# Patient Record
Sex: Male | Born: 1970
Health system: Southern US, Community
[De-identification: ages and names within clinical notes are randomized; demographics above are authoritative.]

## PROBLEM LIST (undated history)

## (undated) DIAGNOSIS — I10 Essential (primary) hypertension: Secondary | ICD-10-CM

## (undated) DIAGNOSIS — Z8619 Personal history of other infectious and parasitic diseases: Secondary | ICD-10-CM

## (undated) DIAGNOSIS — L237 Allergic contact dermatitis due to plants, except food: Secondary | ICD-10-CM

## (undated) DIAGNOSIS — B029 Zoster without complications: Secondary | ICD-10-CM

## (undated) DIAGNOSIS — E781 Pure hyperglyceridemia: Secondary | ICD-10-CM

## (undated) DIAGNOSIS — J329 Chronic sinusitis, unspecified: Secondary | ICD-10-CM

## (undated) DIAGNOSIS — K760 Fatty (change of) liver, not elsewhere classified: Secondary | ICD-10-CM

## (undated) DIAGNOSIS — K219 Gastro-esophageal reflux disease without esophagitis: Secondary | ICD-10-CM

## (undated) HISTORY — DX: Essential (primary) hypertension: I10

## (undated) HISTORY — DX: Allergic contact dermatitis due to plants, except food: L23.7

## (undated) HISTORY — DX: Chronic sinusitis, unspecified: J32.9

## (undated) HISTORY — PX: TOOTH EXTRACTION: SUR596

## (undated) HISTORY — DX: Personal history of other infectious and parasitic diseases: Z86.19

## (undated) HISTORY — DX: Gastro-esophageal reflux disease without esophagitis: K21.9

## (undated) HISTORY — DX: Pure hyperglyceridemia: E78.1

## (undated) HISTORY — DX: Fatty (change of) liver, not elsewhere classified: K76.0

## (undated) HISTORY — PX: WISDOM TOOTH EXTRACTION: SHX21

## (undated) HISTORY — DX: Zoster without complications: B02.9

---

## 2002-06-01 ENCOUNTER — Emergency Department (HOSPITAL_COMMUNITY): Admission: EM | Admit: 2002-06-01 | Discharge: 2002-06-02 | Payer: Self-pay | Admitting: *Deleted

## 2002-06-02 ENCOUNTER — Encounter: Payer: Self-pay | Admitting: *Deleted

## 2013-02-06 ENCOUNTER — Ambulatory Visit (INDEPENDENT_AMBULATORY_CARE_PROVIDER_SITE_OTHER): Payer: 59 | Admitting: Internal Medicine

## 2013-02-06 VITALS — BP 133/89 | HR 78 | Temp 98.3°F | Resp 16 | Ht 76.0 in | Wt 208.0 lb

## 2013-02-06 DIAGNOSIS — R509 Fever, unspecified: Secondary | ICD-10-CM

## 2013-02-06 DIAGNOSIS — T148 Other injury of unspecified body region: Secondary | ICD-10-CM

## 2013-02-06 DIAGNOSIS — W57XXXA Bitten or stung by nonvenomous insect and other nonvenomous arthropods, initial encounter: Secondary | ICD-10-CM

## 2013-02-06 LAB — POCT CBC
Granulocyte percent: 47.1 %G (ref 37–80)
HCT, POC: 42.3 % — AB (ref 43.5–53.7)
Hemoglobin: 13.5 g/dL — AB (ref 14.1–18.1)
Lymph, poc: 1.5 (ref 0.6–3.4)
MCH, POC: 30.9 pg (ref 27–31.2)
MCHC: 31.9 g/dL (ref 31.8–35.4)
MCV: 96.7 fL (ref 80–97)
MID (cbc): 0.3 (ref 0–0.9)
MPV: 9.4 fL (ref 0–99.8)
POC Granulocyte: 1.6 — AB (ref 2–6.9)
POC LYMPH PERCENT: 44.9 %L (ref 10–50)
POC MID %: 8 %M (ref 0–12)
Platelet Count, POC: 204 10*3/uL (ref 142–424)
RBC: 4.37 M/uL — AB (ref 4.69–6.13)
RDW, POC: 12.9 %
WBC: 3.4 10*3/uL — AB (ref 4.6–10.2)

## 2013-02-06 LAB — COMPREHENSIVE METABOLIC PANEL
ALT: 40 U/L (ref 0–53)
AST: 27 U/L (ref 0–37)
Albumin: 4.5 g/dL (ref 3.5–5.2)
Alkaline Phosphatase: 76 U/L (ref 39–117)
BUN: 16 mg/dL (ref 6–23)
CO2: 29 mEq/L (ref 19–32)
Calcium: 9.8 mg/dL (ref 8.4–10.5)
Chloride: 104 mEq/L (ref 96–112)
Creat: 1.18 mg/dL (ref 0.50–1.35)
Glucose, Bld: 104 mg/dL — ABNORMAL HIGH (ref 70–99)
Potassium: 4.4 mEq/L (ref 3.5–5.3)
Sodium: 140 mEq/L (ref 135–145)
Total Bilirubin: 0.5 mg/dL (ref 0.3–1.2)
Total Protein: 7.4 g/dL (ref 6.0–8.3)

## 2013-02-06 MED ORDER — DOXYCYCLINE HYCLATE 100 MG PO TABS: 100.0000 mg | ORAL_TABLET | Freq: Two times a day (BID) | ORAL | Status: DC

## 2013-02-06 NOTE — Progress Notes (Signed)
  Subjective:    Patient ID: Kenneth Austin, male    DOB: 09-27-1970, 42 y.o.   MRN: [id number]  HPI 2 weeks ago he pulled a tick off his right ankle that was engorged and embedded This skin lesion resolved and became red again 2 days ago Yesterday he developed fever and chills with nausea and increased gas//he had a mild headache and felt too dizzy to drive//his face was flushed//he lost his appetite    Review of Systems Denies neck pain, cough, chest pain, nasal congestion other than recent allergies, sore throat, lymph node swelling, rash, abdominal pain, dysuria or frequency, palpitations, edema, joint swelling and tenderness    Objective:   Physical Exam BP 133/89  Pulse 78  Temp(Src) 98.3 F (36.8 C) (Oral)  Resp 16  Ht 6\' 4"  (1.93 m)  Wt 208 lb (94.348 kg)  BMI 25.33 kg/m2  SpO2 100% Pupils equal round reactive to light and accommodation/EOMs conjugate/conjunctiva not injected Nares clear/oropharynx clear/no nodes or thyromegaly Chest clear to auscultation Heart regular without murmur Abdomen benign Neck has full range of motion without tenderness Skin is clear except for tick bite granuloma in the left ankle and 2 small insect bites on the left leg Joints are clear Face is flushed There is no confusion/neurological intact  Results for orders placed in visit on 02/06/13  POCT CBC      Result Value Range   WBC 3.4 (*) 4.6 - 10.2 K/uL   Lymph, poc 1.5  0.6 - 3.4   POC LYMPH PERCENT 44.9  10 - 50 %L   MID (cbc) 0.3  0 - 0.9   POC MID % 8.0  0 - 12 %M   POC Granulocyte 1.6 (*) 2 - 6.9   Granulocyte percent 47.1  37 - 80 %G   RBC 4.37 (*) 4.69 - 6.13 M/uL   Hemoglobin 13.5 (*) 14.1 - 18.1 g/dL   HCT, POC 08.6 (*) 57.8 - 53.7 %   MCV 96.7  80 - 97 fL   MCH, POC 30.9  27 - 31.2 pg   MCHC 31.9  31.8 - 35.4 g/dL   RDW, POC 46.9     Platelet Count, POC 204  142 - 424 K/uL   MPV 9.4  0 - 99.8 fL          Assessment & Plan:  Tick bite with granuloma Fever,  unspecified   Viral illness versus ehrlichiosis versus RMSF Doxy 100bid zofran if needed Close f/u if worse HO given

## 2013-02-07 LAB — ROCKY MTN SPOTTED FVR AB, IGM-BLOOD: ROCKY MTN SPOTTED FEVER, IGM: 0.79 IV

## 2014-01-25 ENCOUNTER — Telehealth: Payer: Self-pay

## 2014-01-25 ENCOUNTER — Ambulatory Visit (INDEPENDENT_AMBULATORY_CARE_PROVIDER_SITE_OTHER): Payer: 59 | Admitting: Family Medicine

## 2014-01-25 ENCOUNTER — Encounter: Payer: Self-pay | Admitting: Family Medicine

## 2014-01-25 VITALS — BP 126/80 | HR 79 | Temp 98.0°F | Resp 16 | Ht 76.0 in | Wt 207.5 lb

## 2014-01-25 DIAGNOSIS — Z0001 Encounter for general adult medical examination with abnormal findings: Secondary | ICD-10-CM | POA: Insufficient documentation

## 2014-01-25 DIAGNOSIS — Z0000 Encounter for general adult medical examination without abnormal findings: Secondary | ICD-10-CM

## 2014-01-25 DIAGNOSIS — L255 Unspecified contact dermatitis due to plants, except food: Secondary | ICD-10-CM

## 2014-01-25 DIAGNOSIS — L237 Allergic contact dermatitis due to plants, except food: Secondary | ICD-10-CM | POA: Insufficient documentation

## 2014-01-25 MED ORDER — TRIAMCINOLONE ACETONIDE 0.1 % EX OINT: 1.0000 "application " | TOPICAL_OINTMENT | Freq: Two times a day (BID) | CUTANEOUS | Status: DC

## 2014-01-25 MED ORDER — PREDNISONE 10 MG PO TABS: ORAL_TABLET | ORAL | Status: DC

## 2014-01-25 NOTE — Progress Notes (Signed)
   Subjective:    Patient ID: Kenneth Austin, male    DOB: 11-14-1970, 43 y.o.   MRN: [id number]  HPI New to establish.  Previous MD- none recently  CPE- no concerns.   Review of Systems Patient reports no vision/hearing changes, anorexia, fever ,adenopathy, persistant/recurrent hoarseness, swallowing issues, chest pain, palpitations, edema, persistant/recurrent cough, hemoptysis, dyspnea (rest,exertional, paroxysmal nocturnal), gastrointestinal  bleeding (melena, rectal bleeding), abdominal pain, excessive heart burn, GU symptoms (dysuria, hematuria, voiding/incontinence issues) syncope, focal weakness, memory loss, numbness & tingling, skin/hair/nail changes, depression, anxiety, abnormal bruising/bleeding, musculoskeletal symptoms/signs.     Objective:   Physical Exam General Appearance:    Alert, cooperative, no distress, appears stated age  Head:    Normocephalic, without obvious abnormality, atraumatic  Eyes:    PERRL, conjunctiva/corneas clear, EOM's intact, fundi    benign, both eyes       Ears:    Normal TM's and external ear canals, both ears  Nose:   Nares normal, septum midline, mucosa normal, no drainage   or sinus tenderness  Throat:   Lips, mucosa, and tongue normal; teeth and gums normal  Neck:   Supple, symmetrical, trachea midline, no adenopathy;       thyroid:  No enlargement/tenderness/nodules  Back:     Symmetric, no curvature, ROM normal, no CVA tenderness  Lungs:     Clear to auscultation bilaterally, respirations unlabored  Chest wall:    No tenderness or deformity  Heart:    Regular rate and rhythm, S1 and S2 normal, no murmur, rub   or gallop  Abdomen:     Soft, non-tender, bowel sounds active all four quadrants,    no masses, no organomegaly  Genitalia:    Normal male without lesion, masses,discharge or tenderness  Rectal:    Deferred due to young age  Extremities:   Extremities normal, atraumatic, no cyanosis or edema  Pulses:   2+ and symmetric all  extremities  Skin:   Skin color, texture, turgor normal, poison ivy on forearms bilaterally and on R flank  Lymph nodes:   Cervical, supraclavicular, and axillary nodes normal  Neurologic:   CNII-XII intact. Normal strength, sensation and reflexes      throughout          Assessment & Plan:

## 2014-01-25 NOTE — Telephone Encounter (Signed)
Medication List and allergies:  Reviewed and updated  90 day supply/mail order: n/a Local prescriptions:  [id number] - [place], [place] - [address] AT [place]  Immunization due:  UTD  A/P: Personal, family and PSH: Reviewed and updated Tdap- received in 2012 per patient   To discuss with provider: Pt would like a physical with labs and shingles vaccine.

## 2014-01-25 NOTE — Progress Notes (Signed)
Pre visit review using our clinic review tool, if applicable. No additional management support is needed unless otherwise documented below in the visit note. 

## 2014-01-25 NOTE — Assessment & Plan Note (Signed)
Pt's PE WNL w/ exception of poison ivy.  Check labs.  Anticipatory guidance provided.

## 2014-01-25 NOTE — Assessment & Plan Note (Signed)
New.  Start pred taper and topical triamcinolone.  Reviewed supportive care and red flags that should prompt return.  Pt expressed understanding and is in agreement w/ plan.

## 2014-01-25 NOTE — Patient Instructions (Signed)
Follow up in 1 year or as needed Start the Prednisone as directed- take w/ food We'll notify you of your lab results and make any changes if needed Call with any questions or concerns Welcome!  We're glad to have you! Keep up the good work!

## 2014-01-26 ENCOUNTER — Encounter: Payer: Self-pay | Admitting: General Practice

## 2014-01-26 LAB — CBC WITH DIFFERENTIAL/PLATELET
Basophils Absolute: 0 10*3/uL (ref 0.0–0.1)
Basophils Relative: 0.5 % (ref 0.0–3.0)
Eosinophils Absolute: 0.1 10*3/uL (ref 0.0–0.7)
Eosinophils Relative: 1.4 % (ref 0.0–5.0)
HCT: 44.3 % (ref 39.0–52.0)
Hemoglobin: 14.7 g/dL (ref 13.0–17.0)
Lymphocytes Relative: 40.6 % (ref 12.0–46.0)
Lymphs Abs: 2 10*3/uL (ref 0.7–4.0)
MCHC: 33.1 g/dL (ref 30.0–36.0)
MCV: 92.9 fl (ref 78.0–100.0)
Monocytes Absolute: 0.4 10*3/uL (ref 0.1–1.0)
Monocytes Relative: 7.4 % (ref 3.0–12.0)
Neutro Abs: 2.5 10*3/uL (ref 1.4–7.7)
Neutrophils Relative %: 50.1 % (ref 43.0–77.0)
Platelets: 248 10*3/uL (ref 150.0–400.0)
RBC: 4.77 Mil/uL (ref 4.22–5.81)
RDW: 12.3 % (ref 11.5–15.5)
WBC: 5 10*3/uL (ref 4.0–10.5)

## 2014-01-26 LAB — HEPATIC FUNCTION PANEL
ALT: 35 U/L (ref 0–53)
AST: 31 U/L (ref 0–37)
Albumin: 4.6 g/dL (ref 3.5–5.2)
Alkaline Phosphatase: 77 U/L (ref 39–117)
Bilirubin, Direct: 0 mg/dL (ref 0.0–0.3)
Total Bilirubin: 0.2 mg/dL (ref 0.2–1.2)
Total Protein: 7.8 g/dL (ref 6.0–8.3)

## 2014-01-26 LAB — LIPID PANEL
Cholesterol: 221 mg/dL — ABNORMAL HIGH (ref 0–200)
HDL: 40.6 mg/dL (ref >39.00)
LDL Cholesterol: 110 mg/dL — ABNORMAL HIGH (ref 0–99)
NonHDL: 180.4
Total CHOL/HDL Ratio: 5
Triglycerides: 350 mg/dL — ABNORMAL HIGH (ref 0.0–149.0)
VLDL: 70 mg/dL — ABNORMAL HIGH (ref 0.0–40.0)

## 2014-01-26 LAB — BASIC METABOLIC PANEL
BUN: 14 mg/dL (ref 6–23)
CO2: 28 mEq/L (ref 19–32)
Calcium: 9.7 mg/dL (ref 8.4–10.5)
Chloride: 101 mEq/L (ref 96–112)
Creatinine, Ser: 1 mg/dL (ref 0.4–1.5)
GFR: 84.64 mL/min (ref >60.00)
Glucose, Bld: 74 mg/dL (ref 70–99)
Potassium: 3.9 mEq/L (ref 3.5–5.1)
Sodium: 138 mEq/L (ref 135–145)

## 2014-01-26 LAB — TSH: TSH: 1.12 u[IU]/mL (ref 0.35–4.50)

## 2015-01-12 ENCOUNTER — Encounter: Payer: Self-pay | Admitting: Family Medicine

## 2015-01-12 ENCOUNTER — Ambulatory Visit (INDEPENDENT_AMBULATORY_CARE_PROVIDER_SITE_OTHER): Payer: 59 | Admitting: Family Medicine

## 2015-01-12 VITALS — BP 122/80 | HR 84 | Temp 98.5°F | Resp 16 | Wt 215.2 lb

## 2015-01-12 DIAGNOSIS — H5789 Other specified disorders of eye and adnexa: Secondary | ICD-10-CM | POA: Insufficient documentation

## 2015-01-12 DIAGNOSIS — H578 Other specified disorders of eye and adnexa: Secondary | ICD-10-CM

## 2015-01-12 DIAGNOSIS — L03211 Cellulitis of face: Secondary | ICD-10-CM | POA: Diagnosis not present

## 2015-01-12 MED ORDER — AMOXICILLIN-POT CLAVULANATE 875-125 MG PO TABS: 1.0000 | ORAL_TABLET | Freq: Two times a day (BID) | ORAL | Status: DC

## 2015-01-12 MED ORDER — POLYMYXIN B-TRIMETHOPRIM 10000-0.1 UNIT/ML-% OP SOLN: OPHTHALMIC | Status: DC

## 2015-01-12 NOTE — Patient Instructions (Signed)
Follow up as needed Start the Augmentin twice daily- take w/ food Hot compresses to improve pain/swelling Start daily Claritin/Zyrtec for allergic inflammation Use the eye drops only if your eye becomes red or starts draining Call with any questions or concerns Hang in there! Have a great weekend!

## 2015-01-12 NOTE — Progress Notes (Signed)
   Subjective:    Patient ID: Kenneth Austin, male    DOB: [birth year], 44 y.o.   MRN: [id number]  HPI R eye swelling- woke up yesterday w/ R under eye swollen and tender.  This AM woke up w/ entire R eye crusted shut.  No redness of conjunctiva.  Pt is outside regularly.  No recent sinus pressure/congestion.  Hx of recurrent 'pink eye'.  No one at home w/ similar sxs.   Review of Systems For ROS see HPI     Objective:   Physical Exam  Constitutional: He is oriented to person, place, and time. He appears well-developed and well-nourished. No distress.  HENT:  Head: Normocephalic and atraumatic.  Right Ear: External ear normal.  Left Ear: External ear normal.  Mouth/Throat: Oropharynx is clear and moist. No oropharyngeal exudate.  Eyes: Conjunctivae and EOM are normal. Pupils are equal, round, and reactive to light. Right eye exhibits no discharge. Left eye exhibits no discharge.  Erythema and swelling of R lower lid extending inferiorly to cheek bone.  No TTP  Lymphadenopathy:    He has no cervical adenopathy.  Neurological: He is alert and oriented to person, place, and time.  Skin: Skin is warm and dry. There is erythema.  Psychiatric: He has a normal mood and affect. His behavior is normal. Thought content normal.  Vitals reviewed.         Assessment & Plan:

## 2015-01-12 NOTE — Progress Notes (Signed)
Pre visit review using our clinic review tool, if applicable. No additional management support is needed unless otherwise documented below in the visit note. 

## 2015-01-15 ENCOUNTER — Encounter: Payer: Self-pay | Admitting: Family Medicine

## 2015-01-15 NOTE — Assessment & Plan Note (Signed)
New.  Area below R eye is more concerning for facial cellulitis than pink eye.  Start abx.  Start antihistamine to decrease eye drainage and inflammatory response.  Reviewed supportive care and red flags that should prompt return.  Pt expressed understanding and is in agreement w/ plan.

## 2015-01-15 NOTE — Assessment & Plan Note (Signed)
New.  No evidence of pink eye or stye at time of OV.  Pt has hx of recurrent pink eye and is concerned w/ all the rubbing he has been doing, that he will develop this over the holiday weekend.  Explained that the oral abx should adequately tx any conjunctivitis.  Pt was given eye drops to use only as needed in case of conjunctivitis.  Pt expressed understanding and is in agreement w/ plan.

## 2015-01-16 ENCOUNTER — Telehealth: Payer: Self-pay | Admitting: General Practice

## 2015-01-16 ENCOUNTER — Other Ambulatory Visit: Payer: Self-pay | Admitting: Physician Assistant

## 2015-01-16 MED ORDER — DOXYCYCLINE HYCLATE 100 MG PO CAPS: 100.0000 mg | ORAL_CAPSULE | Freq: Two times a day (BID) | ORAL | Status: DC

## 2015-01-16 NOTE — Telephone Encounter (Signed)
Will stop Augmentin -- loose stools and abdominal cramping likely side effect.  If no loose stools remain or BRBPR there is little concern for c. Diff. Will switch to Doxycycline for facial cellutlitis.  Patient to follow-up via phone tomorrow regarding how he is doing.

## 2015-01-16 NOTE — Telephone Encounter (Signed)
Per Mychart message:   Please call patient to triage -- if loose stools and abdominal pain still present, needs appointment. If resolved then we will triage based on any other symptoms remaining.         ----- Message -----     From: Kris Hartmann, CMA     Sent: 01/16/2015  8:06 AM      To: Brunetta Jeans, PA-C    Subject: FW: Visit Follow-Up Question                         ----- Message -----     From: Roxine Caddy     Sent: 01/15/2015  1:54 PM      To: [facility]    Subject: Visit Follow-Up Question                   The Augmentin that I have been taking since Friday for the cellulitis around my eye has caused stomach pains and loose stool. I have been taking it with food. Today I have a lot of bright red blood in my stool. My eye is getting better. Should I switch to another antibiotic?    Thanks,    Kenneth Austin    [phone]      Spoke with patient. He advised that he stopped the augmentin yesterday (only took 1 of 2 doses). Pt states that he is not having abdominal pain today and no loose stools or bleeding. Please advise.

## 2015-03-02 ENCOUNTER — Ambulatory Visit (INDEPENDENT_AMBULATORY_CARE_PROVIDER_SITE_OTHER): Payer: 59 | Admitting: Family Medicine

## 2015-03-02 ENCOUNTER — Encounter: Payer: Self-pay | Admitting: Family Medicine

## 2015-03-02 VITALS — BP 120/80 | HR 100 | Temp 99.1°F | Resp 16 | Wt 210.4 lb

## 2015-03-02 DIAGNOSIS — L237 Allergic contact dermatitis due to plants, except food: Secondary | ICD-10-CM | POA: Diagnosis not present

## 2015-03-02 MED ORDER — TRIAMCINOLONE ACETONIDE 0.1 % EX OINT: 1.0000 "application " | TOPICAL_OINTMENT | Freq: Two times a day (BID) | CUTANEOUS | Status: DC

## 2015-03-02 NOTE — Progress Notes (Signed)
Pre visit review using our clinic review tool, if applicable. No additional management support is needed unless otherwise documented below in the visit note. 

## 2015-03-02 NOTE — Patient Instructions (Signed)
This appears to be plant related contact dermatitis Apply the Triamcinolone twice daily Benadryl as needed at night for itching If no improvement or worsening- please let me know! If there is a tiny little piece of thorn in there, it will come out Call with any questions or concerns Have a great weekend!

## 2015-03-02 NOTE — Progress Notes (Signed)
   Subjective:    Patient ID: Kenneth Austin, male    DOB: 1971/02/12, 44 y.o.   MRN: [id number]  HPI Skin infxn- pt was doing yard work on Sunday and had '2 black spots' on L lower leg.  Scratches present from thorns. Area developed intense redness and spreading satellite lesions.  No drainage, no pain.  + itching.  No pus, no fluctuance.  Denies fevers, sweats, chills.   Review of Systems For ROS see HPI     Objective:   Physical Exam  Constitutional: He appears well-developed and well-nourished. No distress.  Cardiovascular: Intact distal pulses.   Skin: Skin is warm and dry. Rash (vesicular rash w/ satellite lesions (consisten w/ plant dermatitis) surround 2 smaller, dark scabbed areas) noted. There is erythema (deeply red, almost purple areas on L anterior shin).  Vitals reviewed.         Assessment & Plan:

## 2015-03-03 NOTE — Assessment & Plan Note (Signed)
Pt w/ intense reaction to plant dermatitis.  No evidence of sporotrichicosis.  No evidence of infxn- no pus, no fluctuance, no TTP.  Start w/ topical steroid ointment as area is well confined to L lower leg.  Reviewed supportive care and red flags that should prompt return.  Pt expressed understanding and is in agreement w/ plan.

## 2015-10-24 ENCOUNTER — Ambulatory Visit (INDEPENDENT_AMBULATORY_CARE_PROVIDER_SITE_OTHER): Payer: 59 | Admitting: Family Medicine

## 2015-10-24 ENCOUNTER — Encounter: Payer: Self-pay | Admitting: Family Medicine

## 2015-10-24 VITALS — BP 153/88 | HR 101 | Temp 98.1°F | Resp 16 | Ht 76.0 in | Wt 217.5 lb

## 2015-10-24 DIAGNOSIS — IMO0001 Reserved for inherently not codable concepts without codable children: Secondary | ICD-10-CM | POA: Insufficient documentation

## 2015-10-24 DIAGNOSIS — R030 Elevated blood-pressure reading, without diagnosis of hypertension: Secondary | ICD-10-CM | POA: Diagnosis not present

## 2015-10-24 DIAGNOSIS — R7989 Other specified abnormal findings of blood chemistry: Secondary | ICD-10-CM

## 2015-10-24 LAB — LIPID PANEL
Cholesterol: 214 mg/dL — ABNORMAL HIGH (ref 0–200)
HDL: 46.3 mg/dL (ref >39.00)
NonHDL: 167.27
Total CHOL/HDL Ratio: 5
Triglycerides: 322 mg/dL — ABNORMAL HIGH (ref 0.0–149.0)
VLDL: 64.4 mg/dL — ABNORMAL HIGH (ref 0.0–40.0)

## 2015-10-24 LAB — HEPATIC FUNCTION PANEL
ALBUMIN: 4.7 g/dL (ref 3.5–5.2)
ALK PHOS: 79 U/L (ref 39–117)
ALT: 67 U/L — ABNORMAL HIGH (ref 0–53)
AST: 39 U/L — AB (ref 0–37)
Bilirubin, Direct: 0.1 mg/dL (ref 0.0–0.3)
TOTAL PROTEIN: 7.5 g/dL (ref 6.0–8.3)
Total Bilirubin: 0.6 mg/dL (ref 0.2–1.2)

## 2015-10-24 LAB — CBC WITH DIFFERENTIAL/PLATELET
BASOS ABS: 0 10*3/uL (ref 0.0–0.1)
Basophils Relative: 0.3 % (ref 0.0–3.0)
EOS PCT: 0.5 % (ref 0.0–5.0)
Eosinophils Absolute: 0 10*3/uL (ref 0.0–0.7)
HCT: 44.5 % (ref 39.0–52.0)
Hemoglobin: 15.2 g/dL (ref 13.0–17.0)
LYMPHS ABS: 1.9 10*3/uL (ref 0.7–4.0)
Lymphocytes Relative: 38.9 % (ref 12.0–46.0)
MCHC: 34.2 g/dL (ref 30.0–36.0)
MCV: 91.3 fl (ref 78.0–100.0)
MONO ABS: 0.5 10*3/uL (ref 0.1–1.0)
Monocytes Relative: 9.2 % (ref 3.0–12.0)
Neutro Abs: 2.5 10*3/uL (ref 1.4–7.7)
Neutrophils Relative %: 51.1 % (ref 43.0–77.0)
Platelets: 218 10*3/uL (ref 150.0–400.0)
RBC: 4.88 Mil/uL (ref 4.22–5.81)
RDW: 12.5 % (ref 11.5–15.5)
WBC: 5 10*3/uL (ref 4.0–10.5)

## 2015-10-24 LAB — LDL CHOLESTEROL, DIRECT: Direct LDL: 126 mg/dL

## 2015-10-24 LAB — BASIC METABOLIC PANEL
BUN: 16 mg/dL (ref 6–23)
CHLORIDE: 101 meq/L (ref 96–112)
CO2: 29 meq/L (ref 19–32)
Calcium: 9.7 mg/dL (ref 8.4–10.5)
Creatinine, Ser: 0.99 mg/dL (ref 0.40–1.50)
GFR: 87.85 mL/min (ref >60.00)
GLUCOSE: 91 mg/dL (ref 70–99)
Potassium: 3.9 mEq/L (ref 3.5–5.1)
Sodium: 139 mEq/L (ref 135–145)

## 2015-10-24 LAB — TSH: TSH: 1.7 u[IU]/mL (ref 0.35–4.50)

## 2015-10-24 NOTE — Patient Instructions (Signed)
Follow up in 2-3 weeks to recheck blood pressure We'll notify you of your lab results and make any changes if needed Continue to work on healthy diet (low salt!) and regular exercise Drink plenty of fluids Call with any questions or concerns Hang in there!  We'll figure this out!

## 2015-10-24 NOTE — Progress Notes (Signed)
   Subjective:    Patient ID: Kenneth Austin, male    DOB: 03/29/1971, 45 y.o.   MRN: [id number]  HPI Elevated BP- pt went to dentist this AM and BP reading w/ wrist cuff was 152/110 and 156/107.  Was then hooked to BP machine and readings were done q5 minutes w/ 162/112, 158/103, 166/101, 169/108.  Denies CP, SOB, HAs, visual changes, dizziness, edema.  + family hx of HTN- grandfather had CVA at 30.  Pt reports diet was 'atrocious'- but started improving diet as of Jan 21.  Not exercising.  Pt reports decreased Na intake.     Review of Systems For ROS see HPI     Objective:   Physical Exam  Constitutional: He is oriented to person, place, and time. He appears well-developed and well-nourished. No distress.  HENT:  Head: Normocephalic and atraumatic.  Eyes: Conjunctivae and EOM are normal. Pupils are equal, round, and reactive to light.  Neck: Normal range of motion. Neck supple. No thyromegaly present.  Cardiovascular: Normal rate, regular rhythm, normal heart sounds and intact distal pulses.   No murmur heard. Pulmonary/Chest: Effort normal and breath sounds normal. No respiratory distress.  Abdominal: Soft. Bowel sounds are normal. He exhibits no distension.  Musculoskeletal: He exhibits no edema.  Lymphadenopathy:    He has no cervical adenopathy.  Neurological: He is alert and oriented to person, place, and time. No cranial nerve deficit.  Skin: Skin is warm and dry.  Psychiatric: He has a normal mood and affect. His behavior is normal.  Vitals reviewed.         Assessment & Plan:

## 2015-10-24 NOTE — Assessment & Plan Note (Signed)
New.  Pt's BP was elevated today but this is first episode of elevated BP.  + family hx in grandparents.  Reviewed lifestyle and dietary modifications.  Will get labs.  Pt to return in 2-3 weeks to recheck BP and start meds prn.  Pt expressed understanding and is in agreement w/ plan.

## 2015-10-24 NOTE — Progress Notes (Signed)
Pre visit review using our clinic review tool, if applicable. No additional management support is needed unless otherwise documented below in the visit note. 

## 2015-10-25 ENCOUNTER — Other Ambulatory Visit: Payer: Self-pay | Admitting: General Practice

## 2015-10-25 MED ORDER — FENOFIBRATE 160 MG PO TABS: 160.0000 mg | ORAL_TABLET | Freq: Every day | ORAL | Status: DC

## 2015-11-07 ENCOUNTER — Ambulatory Visit: Payer: 59 | Admitting: Family Medicine

## 2015-12-07 ENCOUNTER — Ambulatory Visit (INDEPENDENT_AMBULATORY_CARE_PROVIDER_SITE_OTHER): Payer: 59 | Admitting: Physician Assistant

## 2015-12-07 ENCOUNTER — Encounter: Payer: Self-pay | Admitting: Physician Assistant

## 2015-12-07 VITALS — BP 138/98 | HR 99 | Temp 98.7°F | Ht 76.0 in | Wt 218.6 lb

## 2015-12-07 DIAGNOSIS — J020 Streptococcal pharyngitis: Secondary | ICD-10-CM | POA: Diagnosis not present

## 2015-12-07 MED ORDER — CEFDINIR 300 MG PO CAPS: 300.0000 mg | ORAL_CAPSULE | Freq: Two times a day (BID) | ORAL | Status: DC

## 2015-12-07 MED FILL — CEFDINIR 300 MG CAPSULE: 300 | 10 days supply | Qty: 20 | Fill #0

## 2015-12-07 NOTE — Patient Instructions (Signed)
Please increase fluids. Get plenty of rest. Tylenol or Ibuprofen for throat pain. Place a humidifier in the bedroom. Take the antibiotic as directed with food.  Follow-up if symptoms are not resolving.  Strep Throat Strep throat is a bacterial infection of the throat. Your health care provider may call the infection tonsillitis or pharyngitis, depending on whether there is swelling in the tonsils or at the back of the throat. Strep throat is most common during the cold months of the year in children who are 43-5 years of age, but it can happen during any season in people of any age. This infection is spread from person to person (contagious) through coughing, sneezing, or close contact. CAUSES Strep throat is caused by the bacteria called Streptococcus pyogenes. RISK FACTORS This condition is more likely to develop in:  People who spend time in crowded places where the infection can spread easily.  People who have close contact with someone who has strep throat. SYMPTOMS Symptoms of this condition include:  Fever or chills.   Redness, swelling, or pain in the tonsils or throat.  Pain or difficulty when swallowing.  White or yellow spots on the tonsils or throat.  Swollen, tender glands in the neck or under the jaw.  Red rash all over the body (rare). DIAGNOSIS This condition is diagnosed by performing a rapid strep test or by taking a swab of your throat (throat culture test). Results from a rapid strep test are usually ready in a few minutes, but throat culture test results are available after one or two days. TREATMENT This condition is treated with antibiotic medicine. HOME CARE INSTRUCTIONS Medicines  Take over-the-counter and prescription medicines only as told by your health care provider.  Take your antibiotic as told by your health care provider. Do not stop taking the antibiotic even if you start to feel better.  Have family members who also have a sore throat or  fever tested for strep throat. They may need antibiotics if they have the strep infection. Eating and Drinking  Do not share food, drinking cups, or personal items that could cause the infection to spread to other people.  If swallowing is difficult, try eating soft foods until your sore throat feels better.  Drink enough fluid to keep your urine clear or pale yellow. General Instructions  Gargle with a salt-water mixture 3-4 times per day or as needed. To make a salt-water mixture, completely dissolve -1 tsp of salt in 1 cup of warm water.  Make sure that all household members wash their hands well.  Get plenty of rest.  Stay home from school or work until you have been taking antibiotics for 24 hours.  Keep all follow-up visits as told by your health care provider. This is important. SEEK MEDICAL CARE IF:  The glands in your neck continue to get bigger.  You develop a rash, cough, or earache.  You cough up a thick liquid that is green, yellow-brown, or bloody.  You have pain or discomfort that does not get better with medicine.  Your problems seem to be getting worse rather than better.  You have a fever. SEEK IMMEDIATE MEDICAL CARE IF:  You have new symptoms, such as vomiting, severe headache, stiff or painful neck, chest pain, or shortness of breath.  You have severe throat pain, drooling, or changes in your voice.  You have swelling of the neck, or the skin on the neck becomes red and tender.  You have signs of dehydration, such  as fatigue, dry mouth, and decreased urination.  You become increasingly sleepy, or you cannot wake up completely.  Your joints become red or painful.   This information is not intended to replace advice given to you by your health care provider. Make sure you discuss any questions you have with your health care provider.   Document Released: 08/01/2000 Document Revised: 04/25/2015 Document Reviewed: 11/27/2014 Elsevier Interactive  Patient Education [employer].

## 2015-12-07 NOTE — Progress Notes (Signed)
Pre visit review using our clinic tool,if applicable. No additional management support is needed unless otherwise documented below in the visit note.  

## 2015-12-09 NOTE — Progress Notes (Signed)
Patient presents to clinic today c/o 2 days of sore throat and fatigue. Endorses some mild nasal congestion. Denies fever, chills, cough, chest congestion. Endorses daughter with similar symptoms. States she was diagnosed with strep throat yesterday.  Past Medical History  Diagnosis Date  . Poison ivy   . Shingles   . Sinus infection   . History of chicken pox   . GERD (gastroesophageal reflux disease)     Current Outpatient Prescriptions on File Prior to Visit  Medication Sig Dispense Refill  . fenofibrate 160 MG tablet Take 1 tablet (160 mg total) by mouth daily. (Patient not taking: Reported on 12/07/2015) 30 tablet 6   No current facility-administered medications on file prior to visit.    Allergies  Allergen Reactions  . Augmentin [Amoxicillin-Pot Clavulanate] Other (See Comments)    Abdominal pain and loose stools.     Family History  Problem Relation Age of Onset  . Cancer Mother     lyposarcoma  . Hypertension Paternal Grandfather   . Stroke Paternal Grandfather   . Heart attack Maternal Grandmother     Social History   Social History  . Marital Status: Married    Spouse Name: N/A  . Number of Children: N/A  . Years of Education: N/A   Social History Main Topics  . Smoking status: Never Smoker   . Smokeless tobacco: None  . Alcohol Use: Yes     Comment: socially  . Drug Use: No  . Sexual Activity: Yes   Other Topics Concern  . None   Social History Narrative    Review of Systems - See HPI.  All other ROS are negative.  BP 138/98 mmHg  Pulse 99  Temp(Src) 98.7 F (37.1 C) (Oral)  Ht 6\' 4"  (1.93 m)  Wt 218 lb 9.6 oz (99.156 kg)  BMI 26.62 kg/m2  SpO2 98%  Physical Exam  Constitutional: He is oriented to person, place, and time and well-developed, well-nourished, and in no distress.  HENT:  Head: Normocephalic and atraumatic.  Right Ear: Tympanic membrane normal.  Left Ear: Tympanic membrane normal.  Nose: Nose normal.  Mouth/Throat:  Uvula is midline and mucous membranes are normal. Posterior oropharyngeal erythema present. No oropharyngeal exudate.  Eyes: Conjunctivae are normal.  Neck: Neck supple.  Pulmonary/Chest: Effort normal and breath sounds normal. No respiratory distress. He has no wheezes. He has no rales. He exhibits no tenderness.  Lymphadenopathy:    He has no cervical adenopathy.  Neurological: He is alert and oriented to person, place, and time.  Skin: Skin is warm and dry. No pallor.  Vitals reviewed.   Recent Results (from the past 2160 hour(s))  Lipid panel     Status: Abnormal   Collection Time: 10/24/15 11:54 AM  Result Value Ref Range   Cholesterol 214 (H) 0 - 200 mg/dL    Comment: ATP III Classification       Desirable:  < 200 mg/dL               Borderline High:  200 - 239 mg/dL          High:  > = 240 mg/dL   Triglycerides 322.0 (H) 0.0 - 149.0 mg/dL    Comment: Normal:  <150 mg/dLBorderline High:  150 - 199 mg/dL   HDL 46.30 >39.00 mg/dL   VLDL 64.4 (H) 0.0 - 40.0 mg/dL   Total CHOL/HDL Ratio 5     Comment:  Men          Women1/2 Average Risk     3.4          3.3Average Risk          5.0          4.42X Average Risk          9.6          7.13X Average Risk          15.0          11.0                       NonHDL 167.27     Comment: NOTE:  Non-HDL goal should be 30 mg/dL higher than patient's LDL goal (i.e. LDL goal of < 70 mg/dL, would have non-HDL goal of < 100 mg/dL)  Basic metabolic panel     Status: None   Collection Time: 10/24/15 11:54 AM  Result Value Ref Range   Sodium 139 135 - 145 mEq/L   Potassium 3.9 3.5 - 5.1 mEq/L   Chloride 101 96 - 112 mEq/L   CO2 29 19 - 32 mEq/L   Glucose, Bld 91 70 - 99 mg/dL   BUN 16 6 - 23 mg/dL   Creatinine, Ser 0.99 0.40 - 1.50 mg/dL   Calcium 9.7 8.4 - 10.5 mg/dL   GFR 87.85 >60.00 mL/min  TSH     Status: None   Collection Time: 10/24/15 11:54 AM  Result Value Ref Range   TSH 1.70 0.35 - 4.50 uIU/mL  Hepatic function panel      Status: Abnormal   Collection Time: 10/24/15 11:54 AM  Result Value Ref Range   Total Bilirubin 0.6 0.2 - 1.2 mg/dL   Bilirubin, Direct 0.1 0.0 - 0.3 mg/dL   Alkaline Phosphatase 79 39 - 117 U/L   AST 39 (H) 0 - 37 U/L   ALT 67 (H) 0 - 53 U/L   Total Protein 7.5 6.0 - 8.3 g/dL   Albumin 4.7 3.5 - 5.2 g/dL  CBC with Differential/Platelet     Status: None   Collection Time: 10/24/15 11:54 AM  Result Value Ref Range   WBC 5.0 4.0 - 10.5 K/uL   RBC 4.88 4.22 - 5.81 Mil/uL   Hemoglobin 15.2 13.0 - 17.0 g/dL   HCT 44.5 39.0 - 52.0 %   MCV 91.3 78.0 - 100.0 fl   MCHC 34.2 30.0 - 36.0 g/dL   RDW 12.5 11.5 - 15.5 %   Platelets 218.0 150.0 - 400.0 K/uL   Neutrophils Relative % 51.1 43.0 - 77.0 %   Lymphocytes Relative 38.9 12.0 - 46.0 %   Monocytes Relative 9.2 3.0 - 12.0 %   Eosinophils Relative 0.5 0.0 - 5.0 %   Basophils Relative 0.3 0.0 - 3.0 %   Neutro Abs 2.5 1.4 - 7.7 K/uL   Lymphs Abs 1.9 0.7 - 4.0 K/uL   Monocytes Absolute 0.5 0.1 - 1.0 K/uL   Eosinophils Absolute 0.0 0.0 - 0.7 K/uL   Basophils Absolute 0.0 0.0 - 0.1 K/uL  LDL cholesterol, direct     Status: None   Collection Time: 10/24/15 11:54 AM  Result Value Ref Range   Direct LDL 126.0 mg/dL    Comment: Optimal:  <100 mg/dLNear or Above Optimal:  100-129 mg/dLBorderline High:  130-159 mg/dLHigh:  160-189 mg/dLVery High:  >190 mg/dL    Assessment/Plan: 1. Streptococcal sore throat Rapid strep +. Patient is  penicillin-allergic. Will begin Omnicef BID x 10 days. Supportive measures and OTC medications reviewed with patient.  - cefdinir (OMNICEF) 300 MG capsule; Take 1 capsule (300 mg total) by mouth 2 (two) times daily.  Dispense: 20 capsule; Refill: 0

## 2015-12-10 ENCOUNTER — Encounter: Payer: Self-pay | Admitting: Family Medicine

## 2015-12-10 ENCOUNTER — Ambulatory Visit (INDEPENDENT_AMBULATORY_CARE_PROVIDER_SITE_OTHER): Payer: 59 | Admitting: Family Medicine

## 2015-12-10 VITALS — BP 141/96 | HR 114 | Temp 99.2°F | Ht 76.0 in | Wt 215.6 lb

## 2015-12-10 DIAGNOSIS — B340 Adenovirus infection, unspecified: Secondary | ICD-10-CM

## 2015-12-10 DIAGNOSIS — R509 Fever, unspecified: Secondary | ICD-10-CM | POA: Diagnosis not present

## 2015-12-10 DIAGNOSIS — J029 Acute pharyngitis, unspecified: Secondary | ICD-10-CM | POA: Diagnosis not present

## 2015-12-10 LAB — CBC
HCT: 44.3 % (ref 39.0–52.0)
Hemoglobin: 15 g/dL (ref 13.0–17.0)
MCHC: 33.9 g/dL (ref 30.0–36.0)
MCV: 91.5 fl (ref 78.0–100.0)
PLATELETS: 241 10*3/uL (ref 150.0–400.0)
RBC: 4.84 Mil/uL (ref 4.22–5.81)
RDW: 12.3 % (ref 11.5–15.5)
WBC: 10 10*3/uL (ref 4.0–10.5)

## 2015-12-10 LAB — COMPREHENSIVE METABOLIC PANEL
ALT: 43 U/L (ref 0–53)
AST: 26 U/L (ref 0–37)
Albumin: 4.6 g/dL (ref 3.5–5.2)
Alkaline Phosphatase: 99 U/L (ref 39–117)
BILIRUBIN TOTAL: 0.6 mg/dL (ref 0.2–1.2)
BUN: 12 mg/dL (ref 6–23)
CALCIUM: 10 mg/dL (ref 8.4–10.5)
CO2: 30 meq/L (ref 19–32)
CREATININE: 1.05 mg/dL (ref 0.40–1.50)
Chloride: 102 mEq/L (ref 96–112)
GFR: 82.03 mL/min (ref >60.00)
Glucose, Bld: 98 mg/dL (ref 70–99)
Potassium: 3.9 mEq/L (ref 3.5–5.1)
Sodium: 141 mEq/L (ref 135–145)
Total Protein: 8.1 g/dL (ref 6.0–8.3)

## 2015-12-10 LAB — POCT INFLUENZA A/B
INFLUENZA A, POC: NEGATIVE
INFLUENZA B, POC: NEGATIVE

## 2015-12-10 MED ORDER — HYDROCODONE-HOMATROPINE 5-1.5 MG/5ML PO SYRP: 5.0000 mL | ORAL_SOLUTION | Freq: Four times a day (QID) | ORAL | Status: DC | PRN

## 2015-12-10 MED FILL — HYDROCODONE-HOMATROPINE SYR: 5-1.5 | 6 days supply | Qty: 120 | Fill #0

## 2015-12-10 NOTE — Patient Instructions (Addendum)
Please go to the lab for a blood draw.  I should have your results back later on today and will be in touch.  For the time being continue the omnicef and also ibupforen, tylenol, and other OTC medications as needed, as well as the cough syrup The syrup has hydrocodone which will help with pain- however it will also make you sleepy!  We will touch base with your labs, but please also let me know if you are not improving over the next couple of days

## 2015-12-10 NOTE — Progress Notes (Signed)
Pre visit review using our clinic review tool, if applicable. No additional management support is needed unless otherwise documented below in the visit note. 

## 2015-12-10 NOTE — Progress Notes (Signed)
Kenneth Austin at [facility] [address], [place], [place] [address] [phone]  Date:  12/10/2015   Name:  Kenneth Austin   DOB:  1970-11-20   MRN:  [id number]  PCP:  Annye Asa, MD    Chief Complaint: Sore Throat   History of Present Illness:  Kenneth Austin is a 45 y.o. very pleasant male patient who presents with the following:  Today is Monday. He was seen here on Friday and dx with strep  (this POC result was not entered in record but I confirmed with provider who saw him that he did indeed have a positive rapid test).  Started on omnicef. However he has not gotten better as expected- he may be a little worse.  He has noted a temp (subjective) at home. Still has ST He noted onset of pinkeye early yesterday- his eyes were matted shut. He started some polytrim abx eye drops that he had a home.   He has vomited the last 2 nights- he thinks from severe cough.  His chest muscles hurt from vomiting and cough.  No diarrhea His daughter had strep last week.  She is now recovered after abx.   He is generally in good health Admits he has not been eating or drinking much, partially due to severe ST He is using OTC medications as needed- took ibuprofen already today   Patient Active Problem List   Diagnosis Date Noted  . Elevated BP 10/24/2015  . Facial cellulitis 01/12/2015  . Irritation of right eye 01/12/2015  . Poison ivy 01/25/2014  . Routine general medical examination at a health care facility 01/25/2014    Past Medical History  Diagnosis Date  . Poison ivy   . Shingles   . Sinus infection   . History of chicken pox   . GERD (gastroesophageal reflux disease)     Past Surgical History  Procedure Laterality Date  . Tooth extraction    . Wisdom tooth extraction      Social History  Substance Use Topics  . Smoking status: Never Smoker   . Smokeless tobacco: None  . Alcohol Use: Yes     Comment:  socially    Family History  Problem Relation Age of Onset  . Cancer Mother     lyposarcoma  . Hypertension Paternal Grandfather   . Stroke Paternal Grandfather   . Heart attack Maternal Grandmother     Allergies  Allergen Reactions  . Augmentin [Amoxicillin-Pot Clavulanate] Other (See Comments)    Abdominal pain and loose stools.     Medication list has been reviewed and updated.  Current Outpatient Prescriptions on File Prior to Visit  Medication Sig Dispense Refill  . cefdinir (OMNICEF) 300 MG capsule Take 1 capsule (300 mg total) by mouth 2 (two) times daily. 20 capsule 0  . fenofibrate 160 MG tablet Take 1 tablet (160 mg total) by mouth daily. 30 tablet 6   No current facility-administered medications on file prior to visit.    Review of Systems:  As per HPI- otherwise negative.   Physical Examination: Filed Vitals:   12/10/15 0844  BP: 141/96  Pulse: 114  Temp: 99.2 F (37.3 C)   Filed Vitals:   12/10/15 0844  Height: 6\' 4"  (1.93 m)  Weight: 215 lb 9.6 oz (97.796 kg)   Body mass index is 26.25 kg/(m^2). Ideal Body Weight: Weight in (lb) to have BMI = 25: 205  GEN: WDWN, NAD, Non-toxic, A & O x 3 HEENT: Atraumatic, Normocephalic. Neck supple. No masses, No LAD.  Bilateral TM wnl, oropharynx erythematous but no exudate PEERL,EOMI.   No meningismus Mild bilateral conjunctivitis is apparent Ears and Nose: No external deformity. CV: RRR- mild tachycardia, No M/G/R. No JVD. No thrill. No extra heart sounds. PULM: CTA B, no wheezes, crackles, rhonchi. No retractions. No resp. distress. No accessory muscle use. ABD: S, NT, ND, +BS. No rebound. No HSM.  Benign belly EXTR: No c/c/e NEURO Normal gait.  PSYCH: Normally interactive. Conversant. Not depressed or anxious appearing.  Calm demeanor.   Assessment and Plan: Adenovirus infection  Fever, unspecified - Plan: POCT Influenza A/B, HYDROcodone-homatropine (HYCODAN) 5-1.5 MG/5ML syrup  Acute pharyngitis,  unspecified etiology - Plan: CBC, Comprehensive metabolic panel, Epstein-Barr virus VCA antibody panel, HYDROcodone-homatropine (HYCODAN) 5-1.5 MG/5ML syrup  Here today because he has not gotten better after treatment for acute strep pharyngitis- he has gotten worse.  Suspect that he also has a viral illness. Continue omnicef, added hycodan for pain and cough control.  Encouraged fluids, rest, will check labs   Signed Lamar Blinks, MD  Called to check on him at 6pm- cough syrup is helping. He is able to drink.  He feels a bit better.  He will continue to keep me posted as to his progress Results for orders placed or performed in visit on 12/10/15  CBC  Result Value Ref Range   WBC 10.0 4.0 - 10.5 K/uL   RBC 4.84 4.22 - 5.81 Mil/uL   Platelets 241.0 150.0 - 400.0 K/uL   Hemoglobin 15.0 13.0 - 17.0 g/dL   HCT 44.3 39.0 - 52.0 %   MCV 91.5 78.0 - 100.0 fl   MCHC 33.9 30.0 - 36.0 g/dL   RDW 12.3 11.5 - 15.5 %  Comprehensive metabolic panel  Result Value Ref Range   Sodium 141 135 - 145 mEq/L   Potassium 3.9 3.5 - 5.1 mEq/L   Chloride 102 96 - 112 mEq/L   CO2 30 19 - 32 mEq/L   Glucose, Bld 98 70 - 99 mg/dL   BUN 12 6 - 23 mg/dL   Creatinine, Ser 1.05 0.40 - 1.50 mg/dL   Total Bilirubin 0.6 0.2 - 1.2 mg/dL   Alkaline Phosphatase 99 39 - 117 U/L   AST 26 0 - 37 U/L   ALT 43 0 - 53 U/L   Total Protein 8.1 6.0 - 8.3 g/dL   Albumin 4.6 3.5 - 5.2 g/dL   Calcium 10.0 8.4 - 10.5 mg/dL   GFR 82.03 >60.00 mL/min  POCT Influenza A/B  Result Value Ref Range   Influenza A, POC Negative Negative   Influenza B, POC Negative Negative

## 2015-12-12 ENCOUNTER — Telehealth: Payer: Self-pay | Admitting: Family Medicine

## 2015-12-12 LAB — EPSTEIN-BARR VIRUS VCA ANTIBODY PANEL: EBV NA IGG: 357 U/mL — AB (ref <18.0)

## 2015-12-12 NOTE — Telephone Encounter (Signed)
Relation to [id number] Call back number: [phone] Pharmacy: [facility], [place] [phone] (Phone) [phone] (Fax)           Reason for call:  Past last seen 12/10/15 by Dr. Lorelei Pont and states sore throat, cough and conjunctivitis symtomps have not improved. Please advise

## 2015-12-12 NOTE — Telephone Encounter (Signed)
Called him- he is about the same.  Unsure if any fever.  Voice sounds strong on the phone.  He plans to see how he is doing tomorrow and come in if not improving

## 2015-12-13 ENCOUNTER — Encounter: Payer: Self-pay | Admitting: Family Medicine

## 2015-12-13 NOTE — Telephone Encounter (Signed)
I called and spoke with pt last night- note is on mychart thread

## 2016-01-09 ENCOUNTER — Ambulatory Visit (HOSPITAL_BASED_OUTPATIENT_CLINIC_OR_DEPARTMENT_OTHER)
Admission: RE | Admit: 2016-01-09 | Discharge: 2016-01-09 | Disposition: A | Discharge status: Home / Self Care | Payer: 59 | Source: Ambulatory Visit | Attending: Family Medicine | Admitting: Family Medicine

## 2016-01-09 ENCOUNTER — Encounter: Payer: Self-pay | Admitting: Family Medicine

## 2016-01-09 ENCOUNTER — Ambulatory Visit (INDEPENDENT_AMBULATORY_CARE_PROVIDER_SITE_OTHER): Payer: 59 | Admitting: Family Medicine

## 2016-01-09 VITALS — BP 130/90 | HR 93 | Temp 98.8°F | Wt 212.6 lb

## 2016-01-09 DIAGNOSIS — R053 Chronic cough: Secondary | ICD-10-CM

## 2016-01-09 DIAGNOSIS — J029 Acute pharyngitis, unspecified: Secondary | ICD-10-CM | POA: Diagnosis not present

## 2016-01-09 DIAGNOSIS — R509 Fever, unspecified: Secondary | ICD-10-CM | POA: Diagnosis not present

## 2016-01-09 DIAGNOSIS — R05 Cough: Secondary | ICD-10-CM | POA: Insufficient documentation

## 2016-01-09 LAB — CBC WITH DIFFERENTIAL/PLATELET
BASOS ABS: 0 10*3/uL (ref 0.0–0.1)
BASOS PCT: 0.4 % (ref 0.0–3.0)
EOS ABS: 0.1 10*3/uL (ref 0.0–0.7)
Eosinophils Relative: 1 % (ref 0.0–5.0)
HEMATOCRIT: 43.8 % (ref 39.0–52.0)
Hemoglobin: 14.5 g/dL (ref 13.0–17.0)
LYMPHS ABS: 2.1 10*3/uL (ref 0.7–4.0)
LYMPHS PCT: 39.2 % (ref 12.0–46.0)
MCHC: 33.2 g/dL (ref 30.0–36.0)
MCV: 90.7 fl (ref 78.0–100.0)
MONOS PCT: 9.6 % (ref 3.0–12.0)
Monocytes Absolute: 0.5 10*3/uL (ref 0.1–1.0)
NEUTROS ABS: 2.7 10*3/uL (ref 1.4–7.7)
NEUTROS PCT: 49.8 % (ref 43.0–77.0)
Platelets: 239 10*3/uL (ref 150.0–400.0)
RBC: 4.83 Mil/uL (ref 4.22–5.81)
RDW: 12.7 % (ref 11.5–15.5)
WBC: 5.4 10*3/uL (ref 4.0–10.5)

## 2016-01-09 MED ORDER — ALBUTEROL SULFATE 108 (90 BASE) MCG/ACT IN AEPB: 2.0000 | INHALATION_SPRAY | Freq: Four times a day (QID) | RESPIRATORY_TRACT | Status: DC | PRN

## 2016-01-09 MED ORDER — PREDNISONE 20 MG PO TABS: ORAL_TABLET | ORAL | Status: DC

## 2016-01-09 MED ORDER — HYDROCODONE-HOMATROPINE 5-1.5 MG/5ML PO SYRP: 5.0000 mL | ORAL_SOLUTION | Freq: Four times a day (QID) | ORAL | Status: DC | PRN

## 2016-01-09 MED FILL — predniSONE 20 MG TABS: 20 | 10 days supply | Qty: 15 | Fill #0

## 2016-01-09 MED FILL — PROAIR RESPICLICK INHAL PWD: 108 (90 BAS | 30 days supply | Qty: 1 | Fill #0

## 2016-01-09 MED FILL — HYDROCODONE-HOMATROPINE SYR: 5-1.5 | 6 days supply | Qty: 120 | Fill #0

## 2016-01-09 NOTE — Progress Notes (Signed)
Pre visit review using our clinic tool,if applicable. No additional management support is needed unless otherwise documented below in the visit note.  

## 2016-01-09 NOTE — Patient Instructions (Signed)
Please have your blood drawn, then go downstairs for x-ray.   Assuming there is nothing of concern on your x-ray we will have you use the prednisone for 10 days and the albuterol as needed for bronchospasm/ cough While on the prednisone avoid NSAID medication I will be in touch with your labs and x-ray results asap  You can go ahead and fill the medications today

## 2016-01-09 NOTE — Progress Notes (Signed)
[place] at [facility] [address] [place], [place] [address] [phone]  Date:  01/09/2016   Name:  Kenneth Austin   DOB:  [birth year]   MRN:  [id number]  PCP:  Annye Asa, MD    Chief Complaint: Cough   History of Present Illness:  Kenneth Austin is a 45 y.o. very pleasant male patient who presents with the following:  I saw this pt about one month ago with sx of illness and adenovirus.  He was originally dx with strep and treated with omnicef- came back to see me a couple of days later with sx more typical of a virus but we did complete course of treatment.  He notes that he did improve but never felt 100 % well since his illness last month   Last week his ST came back- then it improved again He notes that he still has chest congestion, he will wheeze and cough if he is laying down at night.    He did rx cough syrup and polytrim drops.  Finished the cough syrup  He has lost a little weight during the first week he was sick, but his appetite is ok.  Not continuing to lose weight He still notes that he will get SOB and has less energy when he is working He does not have any CP  He never had asthma as a child.   He is not running any fevers.  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 He is not sure if this is exacerbating his sx  Patient Active Problem List   Diagnosis Date Noted  . Elevated BP 10/24/2015  . Facial cellulitis 01/12/2015  . Irritation of right eye 01/12/2015  . Poison ivy 01/25/2014  . Routine general medical examination at a health care facility 01/25/2014    Past Medical History  Diagnosis Date  . Poison ivy   . Shingles   . Sinus infection   . History of chicken pox   . GERD (gastroesophageal reflux disease)     Past Surgical History  Procedure Laterality Date  . Tooth extraction    .  Wisdom tooth extraction      Social History  Substance Use Topics  . Smoking status: Never Smoker   . Smokeless tobacco: None  . Alcohol Use: Yes     Comment: socially    Family History  Problem Relation Age of Onset  . Cancer Mother     lyposarcoma  . Hypertension Paternal Grandfather   . Stroke Paternal Grandfather   . Heart attack Maternal Grandmother     Allergies  Allergen Reactions  . Augmentin [Amoxicillin-Pot Clavulanate] Other (See Comments)    Abdominal pain and loose stools.     Medication list has been reviewed and updated.  Current Outpatient Prescriptions on File Prior to Visit  Medication Sig Dispense Refill  . fenofibrate 160 MG tablet Take 1 tablet (160 mg total) by mouth daily. 30 tablet 6  . cefdinir (OMNICEF) 300 MG capsule Take 1 capsule (300 mg total) by mouth 2 (two) times daily. (Patient not taking: Reported on 01/09/2016) 20 capsule 0  . HYDROcodone-homatropine (HYCODAN) 5-1.5 MG/5ML syrup Take 5 mLs by mouth every 6 (six) hours as needed for cough. (Patient not taking: Reported on 01/09/2016) 120 mL 0  . trimethoprim-polymyxin b (  POLYTRIM) ophthalmic solution Place 2 drops into both eyes 3 (three) times daily. Reported on 01/09/2016     No current facility-administered medications on file prior to visit.    Review of Systems:  As per HPI- otherwise negative.   Physical Examination: Filed Vitals:   01/09/16 1028 01/09/16 1032  BP: 146/98 133/98  Pulse: 93   Temp: 98.8 F (37.1 C)    Filed Vitals:   01/09/16 1028  Weight: 212 lb 9.6 oz (96.435 kg)   Body mass index is 25.89 kg/(m^2). Ideal Body Weight:    GEN: WDWN, NAD, Non-toxic, A & O x 3, looks well HEENT: Atraumatic, Normocephalic. Neck supple. No masses, No LAD.  Bilateral TM wnl, oropharynx normal.  PEERL,EOMI.   Ears and Nose: No external deformity. CV: RRR, No M/G/R. No JVD. No thrill. No extra heart sounds. PULM: CTA B, no wheezes, crackles, rhonchi. No retractions. No resp.  distress. No accessory muscle use. EXTR: No c/c/e NEURO Normal gait.  PSYCH: Normally interactive. Conversant. Not depressed or anxious appearing.  Calm demeanor.   Dg Chest 2 View  01/09/2016  CLINICAL DATA:  Cough for 5 weeks EXAM: CHEST  2 VIEW COMPARISON:  None. FINDINGS: No active infiltrate or effusion is seen. There are somewhat prominent perihilar markings with mild peribronchial cuffing and bronchitis is a consideration. Mediastinal and hilar contours are unremarkable. The heart is within normal limits in size. No bony abnormality is seen. IMPRESSION: No active lung disease.  Cannot exclude bronchitis. Electronically Signed   By: Ivar Drape M.D.   On: 01/09/2016 11:32    Assessment and Plan: Persistent cough - Plan: DG Chest 2 View, Albuterol Sulfate (PROAIR RESPICLICK) 123XX123 (90 Base) MCG/ACT AEPB, predniSONE (DELTASONE) 20 MG tablet, CBC with Differential/Platelet  Fever, unspecified - Plan: HYDROcodone-homatropine (HYCODAN) 5-1.5 MG/5ML syrup  Acute pharyngitis, unspecified etiology - Plan: HYDROcodone-homatropine (HYCODAN) 5-1.5 MG/5ML syrup  Here today with persistent lung sx following illness a month ago. At this point will try a course of prednisone for him and also an albuterol inhaler. Refilled his cough syrup  Please have your blood drawn, then go downstairs for x-ray.   Assuming there is nothing of concern on your x-ray we will have you use the prednisone for 10 days and the albuterol as needed for bronchospasm/ cough While on the prednisone avoid NSAID medication I will be in touch with your labs and x-ray results asap  You can go ahead and fill the medications today  Signed Lamar Blinks, MD

## 2016-02-27 ENCOUNTER — Ambulatory Visit: Payer: 59 | Admitting: Family Medicine

## 2016-06-19 ENCOUNTER — Ambulatory Visit (INDEPENDENT_AMBULATORY_CARE_PROVIDER_SITE_OTHER): Payer: 59 | Admitting: Family Medicine

## 2016-06-19 ENCOUNTER — Encounter: Payer: Self-pay | Admitting: Family Medicine

## 2016-06-19 VITALS — BP 124/84 | HR 74 | Temp 98.3°F | Ht 76.0 in | Wt 200.6 lb

## 2016-06-19 DIAGNOSIS — Z131 Encounter for screening for diabetes mellitus: Secondary | ICD-10-CM

## 2016-06-19 DIAGNOSIS — I7300 Raynaud's syndrome without gangrene: Secondary | ICD-10-CM | POA: Diagnosis not present

## 2016-06-19 DIAGNOSIS — Z23 Encounter for immunization: Secondary | ICD-10-CM | POA: Diagnosis not present

## 2016-06-19 DIAGNOSIS — Z130 Encounter for screening for diseases of the blood and blood-forming organs and certain disorders involving the immune mechanism: Secondary | ICD-10-CM | POA: Diagnosis not present

## 2016-06-19 DIAGNOSIS — Z13220 Encounter for screening for lipoid disorders: Secondary | ICD-10-CM | POA: Diagnosis not present

## 2016-06-19 DIAGNOSIS — Z0000 Encounter for general adult medical examination without abnormal findings: Secondary | ICD-10-CM | POA: Diagnosis not present

## 2016-06-19 DIAGNOSIS — F5221 Male erectile disorder: Secondary | ICD-10-CM

## 2016-06-19 LAB — COMPREHENSIVE METABOLIC PANEL
ALT: 36 U/L (ref 0–53)
AST: 23 U/L (ref 0–37)
Albumin: 4.8 g/dL (ref 3.5–5.2)
Alkaline Phosphatase: 87 U/L (ref 39–117)
BILIRUBIN TOTAL: 0.5 mg/dL (ref 0.2–1.2)
BUN: 15 mg/dL (ref 6–23)
CO2: 30 meq/L (ref 19–32)
Calcium: 9.8 mg/dL (ref 8.4–10.5)
Chloride: 104 mEq/L (ref 96–112)
Creatinine, Ser: 1.03 mg/dL (ref 0.40–1.50)
GFR: 83.67 mL/min (ref >60.00)
GLUCOSE: 103 mg/dL — AB (ref 70–99)
POTASSIUM: 4 meq/L (ref 3.5–5.1)
Sodium: 140 mEq/L (ref 135–145)
Total Protein: 7.5 g/dL (ref 6.0–8.3)

## 2016-06-19 LAB — LIPID PANEL
Cholesterol: 179 mg/dL (ref 0–200)
HDL: 44.4 mg/dL (ref >39.00)
NONHDL: 134.94
Total CHOL/HDL Ratio: 4
Triglycerides: 226 mg/dL — ABNORMAL HIGH (ref 0.0–149.0)
VLDL: 45.2 mg/dL — AB (ref 0.0–40.0)

## 2016-06-19 LAB — HEMOGLOBIN A1C: HEMOGLOBIN A1C: 5.7 % (ref 4.6–6.5)

## 2016-06-19 LAB — CBC
HCT: 44.6 % (ref 39.0–52.0)
HEMOGLOBIN: 15 g/dL (ref 13.0–17.0)
MCHC: 33.6 g/dL (ref 30.0–36.0)
MCV: 91.3 fl (ref 78.0–100.0)
Platelets: 211 10*3/uL (ref 150.0–400.0)
RBC: 4.88 Mil/uL (ref 4.22–5.81)
RDW: 13 % (ref 11.5–15.5)
WBC: 3.7 10*3/uL — ABNORMAL LOW (ref 4.0–10.5)

## 2016-06-19 LAB — LDL CHOLESTEROL, DIRECT: Direct LDL: 108 mg/dL

## 2016-06-19 LAB — TSH: TSH: 1.61 u[IU]/mL (ref 0.35–4.50)

## 2016-06-19 LAB — SEDIMENTATION RATE: SED RATE: 2 mm/h (ref 0–15)

## 2016-06-19 MED ORDER — SILDENAFIL CITRATE 20 MG PO TABS: ORAL_TABLET | ORAL | 3 refills | Status: DC

## 2016-06-19 MED FILL — SILDENAFIL 20 MG TABLET: 20 | 6 days supply | Qty: 30 | Fill #0

## 2016-06-19 NOTE — Patient Instructions (Signed)
It was good to see you today- I will be in touch with your labs asap We will try some sildenafil (generic viagra) as needed.  Take 20 - 100 mg (1 - 5 pills) once daily as needed.  If chest pain or other adverse effects stop use I will do some basic labs to look for any other cause of your Raynaud's phenomenon  Please come in for a lab visit only at your convenience for a testosterone level- this needs to be done around 8 am!

## 2016-06-19 NOTE — Progress Notes (Signed)
[place] at [facility] [address] [place], [place] [address] [phone]  Date:  06/19/2016   Name:  Kenneth Austin   DOB:  17-Jan-1971   MRN:  [id number]  PCP:  Annye Asa, MD    Chief Complaint: Annual Exam (Pt here to physical and is fasting for labs. )   History of Present Illness:  Kenneth Austin is a 45 y.o. very pleasant male patient who presents with the following:  Here today for a CPE- he is fasting for labs today.  I had seen him in the spring when he was sick.  He is not back in his normal state of good health  History of elevated BP, GERD He has 4 kids- 14, 16, 17 and 12. 2 are his step children who are with them full time.   His chl was a little high in March- he has been eating better and would like to recheck ths today  BP Readings from Last 3 Encounters:  06/19/16 124/84  01/09/16 130/90  12/10/15 (!) 141/96   Wt Readings from Last 3 Encounters:  06/19/16 200 lb 9.6 oz (91 kg)  01/09/16 212 lb 9.6 oz (96.4 kg)  12/10/15 215 lb 9.6 oz (97.8 kg)   He has lost some weight through diet and exercise.  He did a "whole 30" program that helped him to lose some weight.   No family history of prostate cancer.    He plans to start exercising more soon.  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  She is trying to maintain a good physical relationship between them, but he has a hard time getting an erection when he senses she is not very enthusiastic.  This is making her more upset.  He has not had erectile difficulty in the past.  No history of CP or other cardiac issues Discussed generic sildenafil and he would like to give this a try Patient Active Problem List    Diagnosis Date Noted  . Elevated BP 10/24/2015  . Facial cellulitis 01/12/2015  . Irritation of right eye 01/12/2015  . Poison ivy 01/25/2014  . Routine general medical examination at a health care facility 01/25/2014    Past Medical History:  Diagnosis Date  . GERD (gastroesophageal reflux disease)   . History of chicken pox   . Poison ivy   . Shingles   . Sinus infection     Past Surgical History:  Procedure Laterality Date  . TOOTH EXTRACTION    . WISDOM TOOTH EXTRACTION      Social History  Substance Use Topics  . Smoking status: Never Smoker  . Smokeless tobacco: Not on file  . Alcohol use Yes     Comment: socially    Family History  Problem Relation Age of Onset  . Cancer Mother     lyposarcoma  . Hypertension Paternal Grandfather   . Stroke Paternal Grandfather   . Heart attack Maternal Grandmother     Allergies  Allergen Reactions  . Augmentin [Amoxicillin-Pot Clavulanate] Other (See Comments)    Abdominal pain and loose stools.     Medication list has been reviewed and updated.  No current outpatient prescriptions on file prior to visit.   No current facility-administered medications on file prior to visit.     Review of Systems:  As per HPI- otherwise negative.   Physical Examination: Vitals:   06/19/16 0941  BP: 124/84  Pulse: 74  Temp: 98.3 F (36.8 C)   Vitals:   06/19/16 0941  Weight: 200 lb 9.6 oz (91 kg)  Height: '6\' 4"'  (1.93 m)   Body mass index is 24.42 kg/m. Ideal Body Weight: Weight in (lb) to have BMI = 25: 205  GEN: WDWN, NAD, Non-toxic, A & O x 3, normal weight, looks well HEENT: Atraumatic, Normocephalic. Neck supple. No masses, No LAD.  Bilateral TM wnl, oropharynx normal.  PEERL,EOMI.   Ears and Nose: No external deformity. CV: RRR, No M/G/R. No JVD. No thrill. No extra heart sounds. PULM: CTA B, no wheezes, crackles, rhonchi. No retractions. No resp. distress. No accessory muscle use. ABD: S, NT, ND EXTR: No  c/c/e NEURO Normal gait.  PSYCH: Normally interactive. Conversant. Not depressed or anxious appearing.  Calm demeanor.    Assessment and Plan: Physical exam  Screening for diabetes mellitus - Plan: Comprehensive metabolic panel, Hemoglobin A1C  Screening for hyperlipidemia - Plan: Lipid panel  Screening for deficiency anemia - Plan: CBC  Erectile disorder, acquired, generalized, moderate - Plan: sildenafil (REVATIO) 20 MG tablet, Testosterone Total,Free,Bio, Males  Raynaud's phenomenon without gangrene - Plan: TSH, Sed Rate (ESR), Antinuclear Antib (ANA)  Encounter for immunization - Plan: Flu Vaccine QUAD 36+ mos IM  CPE today- labs pending He has lost some weight and we hope to see this reflected in his labs Trial of revatio for his mild ED He would like a T level- ordered for him to have done at a later date, it is now too late in the day He has noted some sx of Raynaud's which are worse in the winter months with his hands and feet feeling very cold and turning red/ blue/ white.  Will do basic labs to look for any secondary cause, and offered a trial of CCB.  He prefers to treat conservatively for now  Will plan further follow- up pending labs.  Meds ordered this encounter  Medications  . ampicillin (PRINCIPEN) 500 MG capsule    Sig: Take 500 mg by mouth 2 (two) times daily.  . Multiple Vitamin (MULTIVITAMIN) tablet    Sig: Take 1 tablet by mouth daily.  . sildenafil (REVATIO) 20 MG tablet    Sig: Take 20- 100 mg once daily as needed for ED    Dispense:  30 tablet    Refill:  3      Signed Lamar Blinks, MD

## 2016-06-19 NOTE — Progress Notes (Signed)
Pre visit review using our clinic review tool, if applicable. No additional management support is needed unless otherwise documented below in the visit note. 

## 2016-06-20 LAB — ANA: ANA: NEGATIVE

## 2016-06-25 ENCOUNTER — Other Ambulatory Visit (INDEPENDENT_AMBULATORY_CARE_PROVIDER_SITE_OTHER): Payer: 59

## 2016-06-25 DIAGNOSIS — F5221 Male erectile disorder: Secondary | ICD-10-CM | POA: Diagnosis not present

## 2016-06-26 LAB — TESTOSTERONE TOTAL,FREE,BIO, MALES
Albumin: 4.9 g/dL (ref 3.6–5.1)
SEX HORMONE BINDING: 21 nmol/L (ref 10–50)
TESTOSTERONE FREE: 58.9 pg/mL (ref 46.0–224.0)
Testosterone, Bioavailable: 131.5 ng/dL (ref 110.0–575.0)
Testosterone: 337 ng/dL (ref 250–827)

## 2016-09-25 MED FILL — SILDENAFIL 20 MG TABLET: 20 | 6 days supply | Qty: 30 | Fill #1

## 2016-10-13 DIAGNOSIS — D2262 Melanocytic nevi of left upper limb, including shoulder: Secondary | ICD-10-CM | POA: Diagnosis not present

## 2016-10-13 DIAGNOSIS — D2261 Melanocytic nevi of right upper limb, including shoulder: Secondary | ICD-10-CM | POA: Diagnosis not present

## 2016-10-13 DIAGNOSIS — D1801 Hemangioma of skin and subcutaneous tissue: Secondary | ICD-10-CM | POA: Diagnosis not present

## 2017-04-30 DIAGNOSIS — Z23 Encounter for immunization: Secondary | ICD-10-CM | POA: Diagnosis not present

## 2017-06-12 ENCOUNTER — Ambulatory Visit (INDEPENDENT_AMBULATORY_CARE_PROVIDER_SITE_OTHER): Payer: 59 | Admitting: Family Medicine

## 2017-06-12 ENCOUNTER — Ambulatory Visit (HOSPITAL_BASED_OUTPATIENT_CLINIC_OR_DEPARTMENT_OTHER)
Admission: RE | Admit: 2017-06-12 | Discharge: 2017-06-12 | Disposition: A | Discharge status: Home / Self Care | Payer: 59 | Source: Ambulatory Visit | Attending: Family Medicine | Admitting: Family Medicine

## 2017-06-12 VITALS — BP 130/88 | HR 90 | Temp 97.7°F | Ht 76.0 in | Wt 225.0 lb

## 2017-06-12 DIAGNOSIS — E781 Pure hyperglyceridemia: Secondary | ICD-10-CM | POA: Diagnosis not present

## 2017-06-12 DIAGNOSIS — R7303 Prediabetes: Secondary | ICD-10-CM | POA: Diagnosis not present

## 2017-06-12 DIAGNOSIS — R109 Unspecified abdominal pain: Secondary | ICD-10-CM | POA: Diagnosis not present

## 2017-06-12 DIAGNOSIS — D72819 Decreased white blood cell count, unspecified: Secondary | ICD-10-CM | POA: Diagnosis not present

## 2017-06-12 DIAGNOSIS — F5221 Male erectile disorder: Secondary | ICD-10-CM | POA: Diagnosis not present

## 2017-06-12 LAB — COMPREHENSIVE METABOLIC PANEL
ALT: 46 U/L (ref 0–53)
AST: 25 U/L (ref 0–37)
Albumin: 4.6 g/dL (ref 3.5–5.2)
Alkaline Phosphatase: 90 U/L (ref 39–117)
BUN: 13 mg/dL (ref 6–23)
CHLORIDE: 101 meq/L (ref 96–112)
CO2: 32 meq/L (ref 19–32)
CREATININE: 1.09 mg/dL (ref 0.40–1.50)
Calcium: 9.8 mg/dL (ref 8.4–10.5)
GFR: 78.02 mL/min (ref >60.00)
Glucose, Bld: 97 mg/dL (ref 70–99)
POTASSIUM: 4 meq/L (ref 3.5–5.1)
Sodium: 138 mEq/L (ref 135–145)
Total Bilirubin: 0.6 mg/dL (ref 0.2–1.2)
Total Protein: 7.6 g/dL (ref 6.0–8.3)

## 2017-06-12 LAB — CBC
HCT: 43.5 % (ref 39.0–52.0)
Hemoglobin: 14.7 g/dL (ref 13.0–17.0)
MCHC: 33.8 g/dL (ref 30.0–36.0)
MCV: 94.2 fl (ref 78.0–100.0)
PLATELETS: 236 10*3/uL (ref 150.0–400.0)
RBC: 4.62 Mil/uL (ref 4.22–5.81)
RDW: 12.5 % (ref 11.5–15.5)
WBC: 6.2 10*3/uL (ref 4.0–10.5)

## 2017-06-12 LAB — LIPID PANEL
CHOLESTEROL: 171 mg/dL (ref 0–200)
HDL: 40.2 mg/dL (ref >39.00)
LDL CALC: 95 mg/dL (ref 0–99)
NonHDL: 130.46
Total CHOL/HDL Ratio: 4
Triglycerides: 178 mg/dL — ABNORMAL HIGH (ref 0.0–149.0)
VLDL: 35.6 mg/dL (ref 0.0–40.0)

## 2017-06-12 LAB — HEMOGLOBIN A1C: Hgb A1c MFr Bld: 5.7 % (ref 4.6–6.5)

## 2017-06-12 MED ORDER — SILDENAFIL CITRATE 20 MG PO TABS: ORAL_TABLET | ORAL | 3 refills | Status: DC

## 2017-06-12 MED FILL — SILDENAFIL 20 MG TABLET: 20 | 6 days supply | Qty: 30 | Fill #2

## 2017-06-12 NOTE — Progress Notes (Signed)
Pre visit review using our clinic review tool, if applicable. No additional management support is needed unless otherwise documented below in the visit note. 

## 2017-06-12 NOTE — Progress Notes (Signed)
Chief Complaint  Patient presents with  . Flank Pain    left side    Subjective: Patient is a 46 y.o. male here for abd pain.  This is been going on for 4 days.  When it first started, he had lots of gurgling in his stomach.  Since then, he has not had a good bowel movement, only passing small amounts.  The pain is a constant ache usually at a 5/36, however with certain movements or pressure applied to his stomach he will have 6/10 pain.  Sitting down and to a lesser amount passing gas/having a bowel movement will help with pain.  His appetite is down.  He denies any injury or recent change in activity.  No fevers, bleeding, nausea, vomiting, urinary complaints, or skin changes.  He has tried Metamucil and Gas-X with minimal relief.  He took MiraLAX yesterday.  ROS: Const: No fevers GI: As noted in HPI  Family History  Problem Relation Age of Onset  . Cancer Mother        lyposarcoma  . Hypertension Paternal Grandfather   . Stroke Paternal Grandfather   . Heart attack Maternal Grandmother    Past Medical History:  Diagnosis Date  . GERD (gastroesophageal reflux disease)   . History of chicken pox   . Poison ivy   . Shingles   . Sinus infection    Allergies  Allergen Reactions  . Augmentin [Amoxicillin-Pot Clavulanate] Other (See Comments)    Abdominal pain and loose stools.     Current Outpatient Prescriptions:  Marland Kitchen  Multiple Vitamin (MULTIVITAMIN) tablet, Take 1 tablet by mouth daily., Disp: , Rfl:  .  sildenafil (REVATIO) 20 MG tablet, Take 20- 100 mg once daily as needed for ED, Disp: 30 tablet, Rfl: 3  Objective: BP 130/88 (BP Location: Left Arm, Patient Position: Sitting, Cuff Size: Large)   Pulse 90   Temp 97.7 F (36.5 C) (Oral)   Ht 6\' 4"  (1.93 m)   Wt 225 lb (102.1 kg)   SpO2 97%   BMI 27.39 kg/m  General: Awake, appears stated age HEENT: MMM, EOMi Heart: RRR, no murmurs Lungs: CTAB, no rales, wheezes or rhonchi. No accessory muscle use Abd: BS+, soft,  TTP over L lateral abd only, mild distension, no masses or organomegaly, negative Murphy's, Rovsing's, McBurney's, and Carnett's Psych: Age appropriate judgment and insight, normal affect and mood  Assessment and Plan: Left lateral abdominal pain - Plan: DG Abd 2 Views  Hypertriglyceridemia - Plan: Comprehensive metabolic panel, Lipid panel  Prediabetes - Plan: Hemoglobin A1c  Leukopenia, unspecified type - Plan: CBC  Erectile disorder, acquired, generalized, moderate - Plan: sildenafil (REVATIO) 20 MG tablet  Orders as above. XR unofficially shows some gas and stool, no signs of obstruction. Await final read. Miralax +enema if no better. Stay well hydrated. Discussed previous labs around 1 year ago. I will order the follow up as he is fasting and requests they be done today. Will have him f/u with Dr. Lorelei Pont after 11/2 for a CPE. The patient voiced understanding and agreement to the plan.  Greater than 25 minutes were spent face to face with the patient with greater than 50% of this time spent counseling on sinister etiologies/presentations of abd pain, constipation treatment, and discussion of labs/follow up.    [place], DO 06/12/17  11:43 AM

## 2017-06-12 NOTE — Patient Instructions (Signed)
Try MiraLAX 1-2 times daily over the next 3-4 days. If no improvement, try using an enema. Stay well hydrated and keep lots of fiber in your diet.  Let us know if you need anything.   

## 2017-10-13 DIAGNOSIS — B078 Other viral warts: Secondary | ICD-10-CM | POA: Diagnosis not present

## 2017-10-13 DIAGNOSIS — L738 Other specified follicular disorders: Secondary | ICD-10-CM | POA: Diagnosis not present

## 2017-10-13 DIAGNOSIS — D2262 Melanocytic nevi of left upper limb, including shoulder: Secondary | ICD-10-CM | POA: Diagnosis not present

## 2017-11-23 DIAGNOSIS — L820 Inflamed seborrheic keratosis: Secondary | ICD-10-CM | POA: Diagnosis not present

## 2017-11-23 DIAGNOSIS — B078 Other viral warts: Secondary | ICD-10-CM | POA: Diagnosis not present

## 2018-06-11 MED FILL — SILDENAFIL CITRATE 20 MG TA: 20 | 30 days supply | Qty: 30 | Fill #0

## 2018-07-27 ENCOUNTER — Encounter: Payer: Self-pay | Admitting: Family Medicine

## 2018-07-27 ENCOUNTER — Ambulatory Visit: Payer: 59 | Admitting: Family Medicine

## 2018-07-27 VITALS — BP 142/80 | HR 87 | Ht 76.0 in | Wt 232.2 lb

## 2018-07-27 DIAGNOSIS — R0683 Snoring: Secondary | ICD-10-CM | POA: Diagnosis not present

## 2018-07-27 DIAGNOSIS — I10 Essential (primary) hypertension: Secondary | ICD-10-CM | POA: Diagnosis not present

## 2018-07-27 DIAGNOSIS — Z0001 Encounter for general adult medical examination with abnormal findings: Secondary | ICD-10-CM | POA: Diagnosis not present

## 2018-07-27 MED ORDER — LISINOPRIL 20 MG PO TABS: 20.0000 mg | ORAL_TABLET | Freq: Every day | ORAL | 0 refills | Status: DC

## 2018-07-27 NOTE — Patient Instructions (Signed)
DASH Eating Plan DASH stands for "Dietary Approaches to Stop Hypertension." The DASH eating plan is a healthy eating plan that has been shown to reduce high blood pressure (hypertension). It may also reduce your risk for type 2 diabetes, heart disease, and stroke. The DASH eating plan may also help with weight loss. What are tips for following this plan? General guidelines  Avoid eating more than 2,300 mg (milligrams) of salt (sodium) a day. If you have hypertension, you may need to reduce your sodium intake to 1,500 mg a day.  Limit alcohol intake to no more than 1 drink a day for nonpregnant women and 2 drinks a day for men. One drink equals 12 oz of beer, 5 oz of wine, or 1 oz of hard liquor.  Work with your health care provider to maintain a healthy body weight or to lose weight. Ask what an ideal weight is for you.  Get at least 30 minutes of exercise that causes your heart to beat faster (aerobic exercise) most days of the week. Activities may include walking, swimming, or biking.  Work with your health care provider or diet and nutrition specialist (dietitian) to adjust your eating plan to your individual calorie needs. Reading food labels  Check food labels for the amount of sodium per serving. Choose foods with less than 5 percent of the Daily Value of sodium. Generally, foods with less than 300 mg of sodium per serving fit into this eating plan.  To find whole grains, look for the word "whole" as the first word in the ingredient list. Shopping  Buy products labeled as "low-sodium" or "no salt added."  Buy fresh foods. Avoid canned foods and premade or frozen meals. Cooking  Avoid adding salt when cooking. Use salt-free seasonings or herbs instead of table salt or sea salt. Check with your health care provider or pharmacist before using salt substitutes.  Do not fry foods. Cook foods using healthy methods such as baking, boiling, grilling, and broiling instead.  Cook with  heart-healthy oils, such as olive, canola, soybean, or sunflower oil. Meal planning   Eat a balanced diet that includes: ? 5 or more servings of fruits and vegetables each day. At each meal, try to fill half of your plate with fruits and vegetables. ? Up to 6-8 servings of whole grains each day. ? Less than 6 oz of lean meat, poultry, or fish each day. A 3-oz serving of meat is about the same size as a deck of cards. One egg equals 1 oz. ? 2 servings of low-fat dairy each day. ? A serving of nuts, seeds, or beans 5 times each week. ? Heart-healthy fats. Healthy fats called Omega-3 fatty acids are found in foods such as flaxseeds and coldwater fish, like sardines, salmon, and mackerel.  Limit how much you eat of the following: ? Canned or prepackaged foods. ? Food that is high in trans fat, such as fried foods. ? Food that is high in saturated fat, such as fatty meat. ? Sweets, desserts, sugary drinks, and other foods with added sugar. ? Full-fat dairy products.  Do not salt foods before eating.  Try to eat at least 2 vegetarian meals each week.  Eat more home-cooked food and less restaurant, buffet, and fast food.  When eating at a restaurant, ask that your food be prepared with less salt or no salt, if possible. What foods are recommended? The items listed may not be a complete list. Talk with your dietitian about what   dietary choices are best for you. Grains Whole-grain or whole-wheat bread. Whole-grain or whole-wheat pasta. Brown rice. Oatmeal. Quinoa. Bulgur. Whole-grain and low-sodium cereals. Pita bread. Low-fat, low-sodium crackers. Whole-wheat flour tortillas. Vegetables Fresh or frozen vegetables (raw, steamed, roasted, or grilled). Low-sodium or reduced-sodium tomato and vegetable juice. Low-sodium or reduced-sodium tomato sauce and tomato paste. Low-sodium or reduced-sodium canned vegetables. Fruits All fresh, dried, or frozen fruit. Canned fruit in natural juice (without  added sugar). Meat and other protein foods Skinless chicken or turkey. Ground chicken or turkey. Pork with fat trimmed off. Fish and seafood. Egg whites. Dried beans, peas, or lentils. Unsalted nuts, nut butters, and seeds. Unsalted canned beans. Lean cuts of beef with fat trimmed off. Low-sodium, lean deli meat. Dairy Low-fat (1%) or fat-free (skim) milk. Fat-free, low-fat, or reduced-fat cheeses. Nonfat, low-sodium ricotta or cottage cheese. Low-fat or nonfat yogurt. Low-fat, low-sodium cheese. Fats and oils Soft margarine without trans fats. Vegetable oil. Low-fat, reduced-fat, or light mayonnaise and salad dressings (reduced-sodium). Canola, safflower, olive, soybean, and sunflower oils. Avocado. Seasoning and other foods Herbs. Spices. Seasoning mixes without salt. Unsalted popcorn and pretzels. Fat-free sweets. What foods are not recommended? The items listed may not be a complete list. Talk with your dietitian about what dietary choices are best for you. Grains Baked goods made with fat, such as croissants, muffins, or some breads. Dry pasta or rice meal packs. Vegetables Creamed or fried vegetables. Vegetables in a cheese sauce. Regular canned vegetables (not low-sodium or reduced-sodium). Regular canned tomato sauce and paste (not low-sodium or reduced-sodium). Regular tomato and vegetable juice (not low-sodium or reduced-sodium). Pickles. Olives. Fruits Canned fruit in a light or heavy syrup. Fried fruit. Fruit in cream or butter sauce. Meat and other protein foods Fatty cuts of meat. Ribs. Fried meat. Bacon. Sausage. Bologna and other processed lunch meats. Salami. Fatback. Hotdogs. Bratwurst. Salted nuts and seeds. Canned beans with added salt. Canned or smoked fish. Whole eggs or egg yolks. Chicken or turkey with skin. Dairy Whole or 2% milk, cream, and half-and-half. Whole or full-fat cream cheese. Whole-fat or sweetened yogurt. Full-fat cheese. Nondairy creamers. Whipped toppings.  Processed cheese and cheese spreads. Fats and oils Butter. Stick margarine. Lard. Shortening. Ghee. Bacon fat. Tropical oils, such as coconut, palm kernel, or palm oil. Seasoning and other foods Salted popcorn and pretzels. Onion salt, garlic salt, seasoned salt, table salt, and sea salt. Worcestershire sauce. Tartar sauce. Barbecue sauce. Teriyaki sauce. Soy sauce, including reduced-sodium. Steak sauce. Canned and packaged gravies. Fish sauce. Oyster sauce. Cocktail sauce. Horseradish that you find on the shelf. Ketchup. Mustard. Meat flavorings and tenderizers. Bouillon cubes. Hot sauce and Tabasco sauce. Premade or packaged marinades. Premade or packaged taco seasonings. Relishes. Regular salad dressings. Where to find more information:  National Heart, Lung, and Blood Institute: www.nhlbi.nih.gov  American Heart Association: www.heart.org Summary  The DASH eating plan is a healthy eating plan that has been shown to reduce high blood pressure (hypertension). It may also reduce your risk for type 2 diabetes, heart disease, and stroke.  With the DASH eating plan, you should limit salt (sodium) intake to 2,300 mg a day. If you have hypertension, you may need to reduce your sodium intake to 1,500 mg a day.  When on the DASH eating plan, aim to eat more fresh fruits and vegetables, whole grains, lean proteins, low-fat dairy, and heart-healthy fats.  Work with your health care provider or diet and nutrition specialist (dietitian) to adjust your eating plan to your individual   calorie needs. This information is not intended to replace advice given to you by your health care provider. Make sure you discuss any questions you have with your health care provider. Document Released: 07/24/2011 Document Revised: 07/28/2016 Document Reviewed: 07/28/2016 Elsevier Interactive Patient Education  2018 [employer]. Lisinopril tablets What is this medicine? LISINOPRIL (lyse [place] oh pril) is an ACE inhibitor.  This medicine is used to treat high blood pressure and heart failure. It is also used to protect the heart immediately after a heart attack. This medicine may be used for other purposes; ask your health care provider or pharmacist if you have questions. COMMON BRAND NAME(S): Prinivil, Zestril What should I tell my health care provider before I take this medicine? They need to know if you have any of these conditions: -diabetes -heart or blood vessel disease -kidney disease -low blood pressure -previous swelling of the tongue, face, or lips with difficulty breathing, difficulty swallowing, hoarseness, or tightening of the throat -an unusual or allergic reaction to lisinopril, other ACE inhibitors, insect venom, foods, dyes, or preservatives -pregnant or trying to get pregnant -breast-feeding How should I use this medicine? Take this medicine by mouth with a glass of water. Follow the directions on your prescription label. You may take this medicine with or without food. If it upsets your stomach, take it with food. Take your medicine at regular intervals. Do not take it more often than directed. Do not stop taking except on your doctor's advice. Talk to your pediatrician regarding the use of this medicine in children. Special care may be needed. While this drug may be prescribed for children as young as 7 years of age for selected conditions, precautions do apply. Overdosage: If you think you have taken too much of this medicine contact a poison control center or emergency room at once. NOTE: This medicine is only for you. Do not share this medicine with others. What if I miss a dose? If you miss a dose, take it as soon as you can. If it is almost time for your next dose, take only that dose. Do not take double or extra doses. What may interact with this medicine? Do not take this medicine with any of the following medications: -hymenoptera venom -sacubitril; valsartan This medicines may also  interact with the following medications: -aliskiren -angiotensin receptor blockers, like losartan or valsartan -certain medicines for diabetes -diuretics -everolimus -gold compounds -lithium -NSAIDs, medicines for pain and inflammation, like ibuprofen or naproxen -potassium salts or supplements -salt substitutes -sirolimus -temsirolimus This list may not describe all possible interactions. Give your health care provider a list of all the medicines, herbs, non-prescription drugs, or dietary supplements you use. Also tell them if you smoke, drink alcohol, or use illegal drugs. Some items may interact with your medicine. What should I watch for while using this medicine? Visit your doctor or health care professional for regular check ups. Check your blood pressure as directed. Ask your doctor what your blood pressure should be, and when you should contact him or her. Do not treat yourself for coughs, colds, or pain while you are using this medicine without asking your doctor or health care professional for advice. Some ingredients may increase your blood pressure. Women should inform their doctor if they wish to become pregnant or think they might be pregnant. There is a potential for serious side effects to an unborn child. Talk to your health care professional or pharmacist for more information. Check with your doctor or health care  professional if you get an attack of severe diarrhea, nausea and vomiting, or if you sweat a lot. The loss of too much body fluid can make it dangerous for you to take this medicine. You may get drowsy or dizzy. Do not drive, use machinery, or do anything that needs mental alertness until you know how this drug affects you. Do not stand or sit up quickly, especially if you are an older patient. This reduces the risk of dizzy or fainting spells. Alcohol can make you more drowsy and dizzy. Avoid alcoholic drinks. Avoid salt substitutes unless you are told otherwise by  your doctor or health care professional. What side effects may I notice from receiving this medicine? Side effects that you should report to your doctor or health care professional as soon as possible: -allergic reactions like skin rash, itching or hives, swelling of the hands, feet, face, lips, throat, or tongue -breathing problems -signs and symptoms of kidney injury like trouble passing urine or change in the amount of urine -signs and symptoms of increased potassium like muscle weakness; chest pain; or fast, irregular heartbeat -signs and symptoms of liver injury like dark yellow or brown urine; general ill feeling or flu-like symptoms; light-colored stools; loss of appetite; nausea; right upper belly pain; unusually weak or tired; yellowing of the eyes or skin -signs and symptoms of low blood pressure like dizziness; feeling faint or lightheaded, falls; unusually weak or tired -stomach pain with or without nausea and vomiting Side effects that usually do not require medical attention (report to your doctor or health care professional if they continue or are bothersome): -changes in taste -cough -dizziness -fever -headache -sensitivity to light This list may not describe all possible side effects. Call your doctor for medical advice about side effects. You may report side effects to FDA at 1-800-FDA-1088. Where should I keep my medicine? Keep out of the reach of children. Store at room temperature between 15 and 30 degrees C (59 and 86 degrees F). Protect from moisture. Keep container tightly closed. Throw away any unused medicine after the expiration date. NOTE: This sheet is a summary. It may not cover all possible information. If you have questions about this medicine, talk to your doctor, pharmacist, or health care provider.  2018 Elsevier/Gold Standard (2015-09-24 12:52:35)

## 2018-07-27 NOTE — Progress Notes (Signed)
Established Patient Office Visit  Subjective:  Patient ID: Kenneth Austin, male    DOB: 1970-12-15  Age: 47 y.o. MRN: [id number]  CC:  Chief Complaint  Patient presents with  . Fatigue    HPI Johann Santone presents for evaluation of elevated blood pressure. He has had this issue for a few months, but his blood pressure has been elevated higher than usual. He is getting readings of 170/115 at home on his electric monitor.  Based patient admits that he has been under a great deal of pressure with his work.  Last night when his pressure measured as above he did experience facial flushing.  He has not had issues with headaches, blurred vision chest pain or shortness of breath.  Pressure has been elevated over the last couple years.  With his home monitor his diastolic pressures have been in the 90s.  Systolic is running in the 130 range.  His weight is increased over the last few years.  He does not smoke.  He drinks a couple times a week and was not specific about the amount.  He does admit to eating fast foods and processed foods.  He does not add salt to his foods.  His father had high blood pressure and managed it with lifestyle changes.  His grandfather, paternal, had a stroke in his early 66s.  Patient lives with his wife.  He is an Chief Financial Officer.  Patient snores loudly.  His wife has not observed any apnea.  He does feel rested in the morning.  He is present today for the exam.  Past Medical History:  Diagnosis Date  . GERD (gastroesophageal reflux disease)   . History of chicken pox   . Poison ivy   . Shingles   . Sinus infection     Past Surgical History:  Procedure Laterality Date  . TOOTH EXTRACTION    . WISDOM TOOTH EXTRACTION      Family History  Problem Relation Age of Onset  . Cancer Mother        lyposarcoma  . Hypertension Paternal Grandfather   . Stroke Paternal Grandfather   . Heart attack Maternal Grandmother     Social History   Socioeconomic History  .  Marital status: Married    Spouse name: Not on file  . Number of children: Not on file  . Years of education: Not on file  . Highest education level: Not on file  Occupational History  . Not on file  Social Needs  . Financial resource strain: Not on file  . Food insecurity:    Worry: Not on file    Inability: Not on file  . Transportation needs:    Medical: Not on file    Non-medical: Not on file  Tobacco Use  . Smoking status: Never Smoker  . Smokeless tobacco: Never Used  Substance and Sexual Activity  . Alcohol use: Yes    Comment: socially  . Drug use: No  . Sexual activity: Yes  Lifestyle  . Physical activity:    Days per week: Not on file    Minutes per session: Not on file  . Stress: Not on file  Relationships  . Social connections:    Talks on phone: Not on file    Gets together: Not on file    Attends religious service: Not on file    Active member of club or organization: Not on file    Attends meetings of clubs or organizations:  Not on file    Relationship status: Not on file  . Intimate partner violence:    Fear of current or ex partner: Not on file    Emotionally abused: Not on file    Physically abused: Not on file    Forced sexual activity: Not on file  Other Topics Concern  . Not on file  Social History Narrative  . Not on file    Outpatient Medications Prior to Visit  Medication Sig Dispense Refill  . Multiple Vitamin (MULTIVITAMIN) tablet Take 1 tablet by mouth daily.    . sildenafil (REVATIO) 20 MG tablet Take 20- 100 mg once daily as needed for ED 30 tablet 3   No facility-administered medications prior to visit.     Allergies  Allergen Reactions  . Augmentin [Amoxicillin-Pot Clavulanate] Other (See Comments)    Abdominal pain and loose stools.     ROS Review of Systems  Constitutional: Negative for diaphoresis, fatigue, fever and unexpected weight change.  HENT: Negative.   Eyes: Negative for photophobia and visual disturbance.    Respiratory: Negative for chest tightness, shortness of breath and wheezing.   Cardiovascular: Negative for chest pain and leg swelling.  Gastrointestinal: Negative.   Endocrine: Negative for polyphagia and polyuria.  Genitourinary: Negative.   Musculoskeletal: Negative for gait problem and joint swelling.  Skin: Negative for pallor.  Neurological: Positive for light-headedness. Negative for dizziness, seizures, facial asymmetry, speech difficulty, numbness and headaches.  Hematological: Does not bruise/bleed easily.  Psychiatric/Behavioral: Negative.       Objective:    Physical Exam  Constitutional: He is oriented to person, place, and time. He appears well-developed and well-nourished. No distress.  HENT:  Head: Normocephalic and atraumatic.  Right Ear: External ear normal.  Left Ear: External ear normal.  Mouth/Throat: No oropharyngeal exudate.  Eyes: Pupils are equal, round, and reactive to light. Conjunctivae are normal. Right eye exhibits no discharge. Left eye exhibits no discharge. No scleral icterus.  Neck: Neck supple. No JVD present. No tracheal deviation present. No thyromegaly present.  Cardiovascular: Normal rate, regular rhythm and normal heart sounds.  Pulmonary/Chest: Effort normal and breath sounds normal. No stridor.  Abdominal: Bowel sounds are normal.  Lymphadenopathy:    He has no cervical adenopathy.  Neurological: He is alert and oriented to person, place, and time.  Skin: Skin is warm and dry. He is not diaphoretic.  Psychiatric: He has a normal mood and affect. His behavior is normal.    BP (!) 142/80   Pulse 87   Ht 6\' 4"  (1.93 m)   Wt 232 lb 4 oz (105.3 kg)   SpO2 98%   BMI 28.27 kg/m  Wt Readings from Last 3 Encounters:  07/27/18 232 lb 4 oz (105.3 kg)  06/12/17 225 lb (102.1 kg)  06/19/16 200 lb 9.6 oz (91 kg)   BP Readings from Last 3 Encounters:  07/27/18 (!) 142/80  06/12/17 130/88  06/19/16 124/84   Health Maintenance Due  Topic  Date Due  . HIV Screening  03/13/1988  . INFLUENZA VACCINE  03/18/2018    There are no preventive care reminders to display for this patient.  Lab Results  Component Value Date   TSH 1.61 06/19/2016   Lab Results  Component Value Date   WBC 6.2 06/12/2017   HGB 14.7 06/12/2017   HCT 43.5 06/12/2017   MCV 94.2 06/12/2017   PLT 236.0 06/12/2017   Lab Results  Component Value Date   NA 138 06/12/2017  K 4.0 06/12/2017   CO2 32 06/12/2017   GLUCOSE 97 06/12/2017   BUN 13 06/12/2017   CREATININE 1.09 06/12/2017   BILITOT 0.6 06/12/2017   ALKPHOS 90 06/12/2017   AST 25 06/12/2017   ALT 46 06/12/2017   PROT 7.6 06/12/2017   ALBUMIN 4.6 06/12/2017   CALCIUM 9.8 06/12/2017   GFR 78.02 06/12/2017   Lab Results  Component Value Date   CHOL 171 06/12/2017   Lab Results  Component Value Date   HDL 40.20 06/12/2017   Lab Results  Component Value Date   LDLCALC 95 06/12/2017   Lab Results  Component Value Date   TRIG 178.0 (H) 06/12/2017   Lab Results  Component Value Date   CHOLHDL 4 06/12/2017   Lab Results  Component Value Date   HGBA1C 5.7 06/12/2017      Assessment & Plan:   Problem List Items Addressed This Visit      Cardiovascular and Mediastinum   Essential hypertension - Primary   Relevant Medications   lisinopril (PRINIVIL,ZESTRIL) 20 MG tablet   Other Relevant Orders   CBC   Comprehensive metabolic panel   Urinalysis, Routine w reflex microscopic   Microalbumin / creatinine urine ratio   TSH     Other   Encounter for health maintenance examination with abnormal findings   Relevant Orders   CBC   Comprehensive metabolic panel   LDL cholesterol, direct   Lipid panel   Urinalysis, Routine w reflex microscopic   Snores   Relevant Orders   Ambulatory referral to Sleep Studies      Meds ordered this encounter  Medications  . lisinopril (PRINIVIL,ZESTRIL) 20 MG tablet    Sig: Take 1 tablet (20 mg total) by mouth daily.    Dispense:   90 tablet    Refill:  0   Patient will return fasting for blood work.  He will start lisinopril and his wife will monitor his blood pressure.  Follow-up will be in a month.  Information was given about the DASH diet and lisinopril itself. Follow-up: Return in about 4 weeks (around 08/24/2018).

## 2018-07-29 ENCOUNTER — Ambulatory Visit: Payer: 59 | Admitting: Family Medicine

## 2018-07-29 ENCOUNTER — Other Ambulatory Visit (INDEPENDENT_AMBULATORY_CARE_PROVIDER_SITE_OTHER): Payer: 59

## 2018-07-29 DIAGNOSIS — Z0001 Encounter for general adult medical examination with abnormal findings: Secondary | ICD-10-CM | POA: Diagnosis not present

## 2018-07-29 DIAGNOSIS — I10 Essential (primary) hypertension: Secondary | ICD-10-CM | POA: Diagnosis not present

## 2018-07-29 LAB — URINALYSIS, ROUTINE W REFLEX MICROSCOPIC
Bilirubin Urine: NEGATIVE
Hgb urine dipstick: NEGATIVE
Ketones, ur: NEGATIVE
Leukocytes, UA: NEGATIVE
Nitrite: NEGATIVE
RBC / HPF: NONE SEEN (ref 0–?)
Specific Gravity, Urine: <=1.005 — AB (ref 1.000–1.030)
Total Protein, Urine: NEGATIVE
UROBILINOGEN UA: 0.2 (ref 0.0–1.0)
Urine Glucose: NEGATIVE
pH: 6 (ref 5.0–8.0)

## 2018-07-29 LAB — CBC
HCT: 47.1 % (ref 39.0–52.0)
Hemoglobin: 16 g/dL (ref 13.0–17.0)
MCHC: 34 g/dL (ref 30.0–36.0)
MCV: 93.4 fl (ref 78.0–100.0)
Platelets: 249 10*3/uL (ref 150.0–400.0)
RBC: 5.04 Mil/uL (ref 4.22–5.81)
RDW: 12.6 % (ref 11.5–15.5)
WBC: 5 10*3/uL (ref 4.0–10.5)

## 2018-07-29 LAB — MICROALBUMIN / CREATININE URINE RATIO
Creatinine,U: 86.3 mg/dL
Microalb Creat Ratio: 0.8 mg/g (ref 0.0–30.0)
Microalb, Ur: <0.7 mg/dL (ref 0.0–1.9)

## 2018-07-29 LAB — COMPREHENSIVE METABOLIC PANEL
ALT: 71 U/L — ABNORMAL HIGH (ref 0–53)
AST: 33 U/L (ref 0–37)
Albumin: 4.9 g/dL (ref 3.5–5.2)
Alkaline Phosphatase: 86 U/L (ref 39–117)
BUN: 15 mg/dL (ref 6–23)
CO2: 31 mEq/L (ref 19–32)
Calcium: 9.8 mg/dL (ref 8.4–10.5)
Chloride: 102 mEq/L (ref 96–112)
Creatinine, Ser: 1.16 mg/dL (ref 0.40–1.50)
GFR: 72.24 mL/min (ref >60.00)
Glucose, Bld: 106 mg/dL — ABNORMAL HIGH (ref 70–99)
Potassium: 4.3 mEq/L (ref 3.5–5.1)
Sodium: 139 mEq/L (ref 135–145)
Total Bilirubin: 0.6 mg/dL (ref 0.2–1.2)
Total Protein: 7.5 g/dL (ref 6.0–8.3)

## 2018-07-29 LAB — LIPID PANEL
CHOL/HDL RATIO: 5
Cholesterol: 207 mg/dL — ABNORMAL HIGH (ref 0–200)
HDL: 44.9 mg/dL (ref >39.00)
NonHDL: 162.39
Triglycerides: 252 mg/dL — ABNORMAL HIGH (ref 0.0–149.0)
VLDL: 50.4 mg/dL — AB (ref 0.0–40.0)

## 2018-07-29 LAB — TSH: TSH: 2.02 u[IU]/mL (ref 0.35–4.50)

## 2018-07-29 LAB — LDL CHOLESTEROL, DIRECT: Direct LDL: 140 mg/dL

## 2018-08-24 ENCOUNTER — Encounter: Payer: Self-pay | Admitting: Family Medicine

## 2018-08-24 ENCOUNTER — Ambulatory Visit: Payer: 59 | Admitting: Family Medicine

## 2018-08-24 VITALS — BP 138/98 | HR 97 | Temp 98.9°F | Ht 76.0 in | Wt 230.1 lb

## 2018-08-24 DIAGNOSIS — R945 Abnormal results of liver function studies: Secondary | ICD-10-CM | POA: Diagnosis not present

## 2018-08-24 DIAGNOSIS — I10 Essential (primary) hypertension: Secondary | ICD-10-CM | POA: Diagnosis not present

## 2018-08-24 DIAGNOSIS — Z23 Encounter for immunization: Secondary | ICD-10-CM | POA: Diagnosis not present

## 2018-08-24 DIAGNOSIS — R7989 Other specified abnormal findings of blood chemistry: Secondary | ICD-10-CM

## 2018-08-24 MED ORDER — LISINOPRIL 20 MG PO TABS: 30.0000 mg | ORAL_TABLET | Freq: Every day | ORAL | 0 refills | Status: DC

## 2018-08-24 NOTE — Progress Notes (Signed)
Established Patient Office Visit  Subjective:  Patient ID: Kenneth Austin, male    DOB: [birth year]  Age: 48 y.o. MRN: [id number]  CC:  Chief Complaint  Patient presents with  . Follow-up    HPI Kenneth Austin presents for follow-up of his blood pressure.  He brings in a list of blood pressures running in the upper 130s to low 140s over the upper 80s to 90 range.  He is tolerating the lisinopril well.  He is experiencing some cough but thinks it may be associated with a virus that is moving through his office at work.  Discussed blood work been elevated liver enzyme.  Patient does not take Tylenol on a regular basis.  He does drink 3 drinks a couple times a week.  Triglycerides were somewhat elevated  Past Medical History:  Diagnosis Date  . GERD (gastroesophageal reflux disease)   . History of chicken pox   . Poison ivy   . Shingles   . Sinus infection     Past Surgical History:  Procedure Laterality Date  . TOOTH EXTRACTION    . WISDOM TOOTH EXTRACTION      Family History  Problem Relation Age of Onset  . Cancer Mother        lyposarcoma  . Hypertension Paternal Grandfather   . Stroke Paternal Grandfather   . Heart attack Maternal Grandmother     Social History   Socioeconomic History  . Marital status: Married    Spouse name: Not on file  . Number of children: Not on file  . Years of education: Not on file  . Highest education level: Not on file  Occupational History  . Not on file  Social Needs  . Financial resource strain: Not on file  . Food insecurity:    Worry: Not on file    Inability: Not on file  . Transportation needs:    Medical: Not on file    Non-medical: Not on file  Tobacco Use  . Smoking status: Never Smoker  . Smokeless tobacco: Never Used  Substance and Sexual Activity  . Alcohol use: Yes    Comment: socially  . Drug use: No  . Sexual activity: Yes  Lifestyle  . Physical activity:    Days per week: Not on file    Minutes  per session: Not on file  . Stress: Not on file  Relationships  . Social connections:    Talks on phone: Not on file    Gets together: Not on file    Attends religious service: Not on file    Active member of club or organization: Not on file    Attends meetings of clubs or organizations: Not on file    Relationship status: Not on file  . Intimate partner violence:    Fear of current or ex partner: Not on file    Emotionally abused: Not on file    Physically abused: Not on file    Forced sexual activity: Not on file  Other Topics Concern  . Not on file  Social History Narrative  . Not on file    Outpatient Medications Prior to Visit  Medication Sig Dispense Refill  . Multiple Vitamin (MULTIVITAMIN) tablet Take 1 tablet by mouth daily.    . sildenafil (REVATIO) 20 MG tablet Take 20- 100 mg once daily as needed for ED 30 tablet 3  . lisinopril (PRINIVIL,ZESTRIL) 20 MG tablet Take 1 tablet (20 mg total) by mouth daily.  90 tablet 0   No facility-administered medications prior to visit.     Allergies  Allergen Reactions  . Augmentin [Amoxicillin-Pot Clavulanate] Other (See Comments)    Abdominal pain and loose stools.     ROS Review of Systems  Constitutional: Negative for chills, diaphoresis, fatigue, fever and unexpected weight change.  Respiratory: Negative.   Cardiovascular: Negative.   Gastrointestinal: Negative.   Neurological: Negative for headaches.  Psychiatric/Behavioral: Negative.       Objective:    Physical Exam  Constitutional: He is oriented to person, place, and time. He appears well-developed and well-nourished. No distress.  HENT:  Head: Normocephalic and atraumatic.  Right Ear: External ear normal.  Left Ear: External ear normal.  Eyes: Right eye exhibits no discharge. Left eye exhibits no discharge. No scleral icterus.  Neck: No JVD present. No tracheal deviation present.  Pulmonary/Chest: Effort normal and breath sounds normal. No stridor.    Neurological: He is alert and oriented to person, place, and time.  Skin: Skin is warm and dry. He is not diaphoretic.  Psychiatric: He has a normal mood and affect. His behavior is normal.    BP (!) 138/98   Pulse 97   Temp 98.9 F (37.2 C) (Oral)   Ht 6\' 4"  (1.93 m)   Wt 230 lb 2 oz (104.4 kg)   SpO2 98%   BMI 28.01 kg/m  Wt Readings from Last 3 Encounters:  08/24/18 230 lb 2 oz (104.4 kg)  07/27/18 232 lb 4 oz (105.3 kg)  06/12/17 225 lb (102.1 kg)   BP Readings from Last 3 Encounters:  08/24/18 (!) 138/98  07/27/18 (!) 142/80  06/12/17 130/88   Guideline developer:  UpToDate (see UpToDate for funding source) Date Released: June 2014  Health Maintenance Due  Topic Date Due  . HIV Screening  03/13/1988    There are no preventive care reminders to display for this patient.  Lab Results  Component Value Date   TSH 2.02 07/29/2018   Lab Results  Component Value Date   WBC 5.0 07/29/2018   HGB 16.0 07/29/2018   HCT 47.1 07/29/2018   MCV 93.4 07/29/2018   PLT 249.0 07/29/2018   Lab Results  Component Value Date   NA 139 07/29/2018   K 4.3 07/29/2018   CO2 31 07/29/2018   GLUCOSE 106 (H) 07/29/2018   BUN 15 07/29/2018   CREATININE 1.16 07/29/2018   BILITOT 0.6 07/29/2018   ALKPHOS 86 07/29/2018   AST 33 07/29/2018   ALT 71 (H) 07/29/2018   PROT 7.5 07/29/2018   ALBUMIN 4.9 07/29/2018   CALCIUM 9.8 07/29/2018   GFR 72.24 07/29/2018   Lab Results  Component Value Date   CHOL 207 (H) 07/29/2018   Lab Results  Component Value Date   HDL 44.90 07/29/2018   Lab Results  Component Value Date   LDLCALC 95 06/12/2017   Lab Results  Component Value Date   TRIG 252.0 (H) 07/29/2018   Lab Results  Component Value Date   CHOLHDL 5 07/29/2018   Lab Results  Component Value Date   HGBA1C 5.7 06/12/2017      Assessment & Plan:   Problem List Items Addressed This Visit      Cardiovascular and Mediastinum   Essential hypertension - Primary    Relevant Medications   lisinopril (PRINIVIL,ZESTRIL) 20 MG tablet     Other   Elevated LFTs   Relevant Orders   Hepatic function panel   Need for immunization against  influenza   Relevant Orders   Flu Vaccine QUAD 36+ mos IM (Completed)      Meds ordered this encounter  Medications  . lisinopril (PRINIVIL,ZESTRIL) 20 MG tablet    Sig: Take 1.5 tablets (30 mg total) by mouth daily.    Dispense:  135 tablet    Refill:  0    Follow-up: Return in about 3 months (around 11/23/2018), or return for liver function panel. Check and record BPs..    Patient will abstain from alcohol for a week and then follow-up for liver function panel.  Discussed fatty liver disease associated with elevated triglycerides.  Have increased lisinopril to 30 mg.  Patient will continue to check his blood pressures and follow-up in 3 months.  Encouraged him to move forward with a sleep study as that will help lower his blood pressure as well.  He was given information on the DASH diet.

## 2018-08-24 NOTE — Patient Instructions (Signed)
DASH Eating Plan  DASH stands for "Dietary Approaches to Stop Hypertension." The DASH eating plan is a healthy eating plan that has been shown to reduce high blood pressure (hypertension). It may also reduce your risk for type 2 diabetes, heart disease, and stroke. The DASH eating plan may also help with weight loss.  What are tips for following this plan?    General guidelines   Avoid eating more than 2,300 mg (milligrams) of salt (sodium) a day. If you have hypertension, you may need to reduce your sodium intake to 1,500 mg a day.   Limit alcohol intake to no more than 1 drink a day for nonpregnant women and 2 drinks a day for men. One drink equals 12 oz of beer, 5 oz of wine, or 1 oz of hard liquor.   Work with your health care provider to maintain a healthy body weight or to lose weight. Ask what an ideal weight is for you.   Get at least 30 minutes of exercise that causes your heart to beat faster (aerobic exercise) most days of the week. Activities may include walking, swimming, or biking.   Work with your health care provider or diet and nutrition specialist (dietitian) to adjust your eating plan to your individual calorie needs.  Reading food labels     Check food labels for the amount of sodium per serving. Choose foods with less than 5 percent of the Daily Value of sodium. Generally, foods with less than 300 mg of sodium per serving fit into this eating plan.   To find whole grains, look for the word "whole" as the first word in the ingredient list.  Shopping   Buy products labeled as "low-sodium" or "no salt added."   Buy fresh foods. Avoid canned foods and premade or frozen meals.  Cooking   Avoid adding salt when cooking. Use salt-free seasonings or herbs instead of table salt or sea salt. Check with your health care provider or pharmacist before using salt substitutes.   Do not fry foods. Cook foods using healthy methods such as baking, boiling, grilling, and broiling instead.   Cook with  heart-healthy oils, such as olive, canola, soybean, or sunflower oil.  Meal planning   Eat a balanced diet that includes:  ? 5 or more servings of fruits and vegetables each day. At each meal, try to fill half of your plate with fruits and vegetables.  ? Up to 6-8 servings of whole grains each day.  ? Less than 6 oz of lean meat, poultry, or fish each day. A 3-oz serving of meat is about the same size as a deck of cards. One egg equals 1 oz.  ? 2 servings of low-fat dairy each day.  ? A serving of nuts, seeds, or beans 5 times each week.  ? Heart-healthy fats. Healthy fats called Omega-3 fatty acids are found in foods such as flaxseeds and coldwater fish, like sardines, salmon, and mackerel.   Limit how much you eat of the following:  ? Canned or prepackaged foods.  ? Food that is high in trans fat, such as fried foods.  ? Food that is high in saturated fat, such as fatty meat.  ? Sweets, desserts, sugary drinks, and other foods with added sugar.  ? Full-fat dairy products.   Do not salt foods before eating.   Try to eat at least 2 vegetarian meals each week.   Eat more home-cooked food and less restaurant, buffet, and fast food.     When eating at a restaurant, ask that your food be prepared with less salt or no salt, if possible.  What foods are recommended?  The items listed may not be a complete list. Talk with your dietitian about what dietary choices are best for you.  Grains  Whole-grain or whole-wheat bread. Whole-grain or whole-wheat pasta. Brown rice. Oatmeal. Quinoa. Bulgur. Whole-grain and low-sodium cereals. Pita bread. Low-fat, low-sodium crackers. Whole-wheat flour tortillas.  Vegetables  Fresh or frozen vegetables (raw, steamed, roasted, or grilled). Low-sodium or reduced-sodium tomato and vegetable juice. Low-sodium or reduced-sodium tomato sauce and tomato paste. Low-sodium or reduced-sodium canned vegetables.  Fruits  All fresh, dried, or frozen fruit. Canned fruit in natural juice (without  added sugar).  Meat and other protein foods  Skinless chicken or turkey. Ground chicken or turkey. Pork with fat trimmed off. Fish and seafood. Egg whites. Dried beans, peas, or lentils. Unsalted nuts, nut butters, and seeds. Unsalted canned beans. Lean cuts of beef with fat trimmed off. Low-sodium, lean deli meat.  Dairy  Low-fat (1%) or fat-free (skim) milk. Fat-free, low-fat, or reduced-fat cheeses. Nonfat, low-sodium ricotta or cottage cheese. Low-fat or nonfat yogurt. Low-fat, low-sodium cheese.  Fats and oils  Soft margarine without trans fats. Vegetable oil. Low-fat, reduced-fat, or light mayonnaise and salad dressings (reduced-sodium). Canola, safflower, olive, soybean, and sunflower oils. Avocado.  Seasoning and other foods  Herbs. Spices. Seasoning mixes without salt. Unsalted popcorn and pretzels. Fat-free sweets.  What foods are not recommended?  The items listed may not be a complete list. Talk with your dietitian about what dietary choices are best for you.  Grains  Baked goods made with fat, such as croissants, muffins, or some breads. Dry pasta or rice meal packs.  Vegetables  Creamed or fried vegetables. Vegetables in a cheese sauce. Regular canned vegetables (not low-sodium or reduced-sodium). Regular canned tomato sauce and paste (not low-sodium or reduced-sodium). Regular tomato and vegetable juice (not low-sodium or reduced-sodium). Pickles. Olives.  Fruits  Canned fruit in a light or heavy syrup. Fried fruit. Fruit in cream or butter sauce.  Meat and other protein foods  Fatty cuts of meat. Ribs. Fried meat. Bacon. Sausage. Bologna and other processed lunch meats. Salami. Fatback. Hotdogs. Bratwurst. Salted nuts and seeds. Canned beans with added salt. Canned or smoked fish. Whole eggs or egg yolks. Chicken or turkey with skin.  Dairy  Whole or 2% milk, cream, and half-and-half. Whole or full-fat cream cheese. Whole-fat or sweetened yogurt. Full-fat cheese. Nondairy creamers. Whipped toppings.  Processed cheese and cheese spreads.  Fats and oils  Butter. Stick margarine. Lard. Shortening. Ghee. Bacon fat. Tropical oils, such as coconut, palm kernel, or palm oil.  Seasoning and other foods  Salted popcorn and pretzels. Onion salt, garlic salt, seasoned salt, table salt, and sea salt. Worcestershire sauce. Tartar sauce. Barbecue sauce. Teriyaki sauce. Soy sauce, including reduced-sodium. Steak sauce. Canned and packaged gravies. Fish sauce. Oyster sauce. Cocktail sauce. Horseradish that you find on the shelf. Ketchup. Mustard. Meat flavorings and tenderizers. Bouillon cubes. Hot sauce and Tabasco sauce. Premade or packaged marinades. Premade or packaged taco seasonings. Relishes. Regular salad dressings.  Where to find more information:   National Heart, Lung, and Blood Institute: www.nhlbi.nih.gov   American Heart Association: www.heart.org  Summary   The DASH eating plan is a healthy eating plan that has been shown to reduce high blood pressure (hypertension). It may also reduce your risk for type 2 diabetes, heart disease, and stroke.   With the   DASH eating plan, you should limit salt (sodium) intake to 2,300 mg a day. If you have hypertension, you may need to reduce your sodium intake to 1,500 mg a day.   When on the DASH eating plan, aim to eat more fresh fruits and vegetables, whole grains, lean proteins, low-fat dairy, and heart-healthy fats.   Work with your health care provider or diet and nutrition specialist (dietitian) to adjust your eating plan to your individual calorie needs.  This information is not intended to replace advice given to you by your health care provider. Make sure you discuss any questions you have with your health care provider.  Document Released: 07/24/2011 Document Revised: 07/28/2016 Document Reviewed: 07/28/2016  Elsevier Interactive Patient Education  2019 [employer]

## 2018-09-23 ENCOUNTER — Encounter: Payer: Self-pay | Admitting: Family Medicine

## 2018-09-23 ENCOUNTER — Ambulatory Visit: Payer: 59 | Admitting: Family Medicine

## 2018-09-23 VITALS — BP 108/88 | HR 108 | Temp 99.0°F | Ht 76.0 in | Wt 231.0 lb

## 2018-09-23 DIAGNOSIS — R232 Flushing: Secondary | ICD-10-CM | POA: Diagnosis not present

## 2018-09-23 DIAGNOSIS — I10 Essential (primary) hypertension: Secondary | ICD-10-CM | POA: Diagnosis not present

## 2018-09-23 MED ORDER — VALSARTAN 320 MG PO TABS: 320.0000 mg | ORAL_TABLET | Freq: Every day | ORAL | 3 refills | Status: DC

## 2018-09-23 MED FILL — VALSARTAN 320 MG TAB: 320 | 30 days supply | Qty: 30 | Fill #0

## 2018-09-23 NOTE — Progress Notes (Signed)
Chief Complaint  Patient presents with  . Hypertension    Subjective Kenneth Austin is a 48 y.o. male who presents for hypertension follow up. He does monitor home blood pressures. Blood pressures ranging from 130's/80's on average. He is compliant with medication- lisinopril 30 mg/d. Patient has these side effects of medication: dry cough He is not adhering to a healthy diet overall. Current exercise: none  Over the past 2 months, he has been experiencing flushing after he stands up.  This does not help and every time but has happened around 10 times since December.  It will last for several moments and steadily resolved.  When he checks his blood pressure it is elevated.  He does drink lots of water.  He will intermittently have palpitations with it.  No shortness of breath or chest pain when this happens.   Past Medical History:  Diagnosis Date  . GERD (gastroesophageal reflux disease)   . History of chicken pox   . Poison ivy   . Shingles   . Sinus infection     Review of Systems Cardiovascular: no chest pain Respiratory:  no shortness of breath  Exam BP 108/88 (BP Location: Right Arm, Cuff Size: Large)   Pulse (!) 108   Temp 99 F (37.2 C) (Oral)   Ht 6\' 4"  (1.93 m)   Wt 231 lb (104.8 kg)   SpO2 97%   BMI 28.12 kg/m  General:  well developed, well nourished, in no apparent distress HEENT: MMM Heart: RRR, no bruits, no LE edema Lungs: clear to auscultation, no accessory muscle use Psych: well oriented with normal range of affect and appropriate judgment/insight  Essential hypertension - Plan: valsartan (DIOVAN) 320 MG tablet, EKG 12-Lead  Flushing - Plan: EKG 12-Lead  Stop lisinopril, start valsartan.  Check blood pressures at home.  Orthostatics negative, but he did have a 16 point systolic drop in blood pressure from supine to seated.  Continue high fluid intake.  Consider adding something with electrolytes, he will plan on a low calorie/low calorie Powerade.  Counseled on diet and exercise. F/u in 4 weeks, 2 weeks if not having improvement.  Will consider urine metanephrines and 5 HIAA testing as well. The patient voiced understanding and agreement to the plan.  [place], DO 09/23/18  9:54 AM

## 2018-09-23 NOTE — Patient Instructions (Addendum)
Stop the lisinopril for now.   Keep the diet clean and stay active.  Aim to do some physical exertion for 150 minutes per week. This is typically divided into 5 days per week, 30 minutes per day. The activity should be enough to get your heart rate up. Anything is better than nothing if you have time constraints.  Let me know if new blood pressure medicine is too expensive.  Let us know if you need anything.

## 2018-10-14 DIAGNOSIS — D2261 Melanocytic nevi of right upper limb, including shoulder: Secondary | ICD-10-CM | POA: Diagnosis not present

## 2018-10-14 DIAGNOSIS — D2262 Melanocytic nevi of left upper limb, including shoulder: Secondary | ICD-10-CM | POA: Diagnosis not present

## 2018-10-14 DIAGNOSIS — D225 Melanocytic nevi of trunk: Secondary | ICD-10-CM | POA: Diagnosis not present

## 2018-10-21 ENCOUNTER — Ambulatory Visit: Payer: 59 | Admitting: Family Medicine

## 2018-10-21 ENCOUNTER — Encounter: Payer: Self-pay | Admitting: Family Medicine

## 2018-10-21 VITALS — BP 110/80 | HR 103 | Temp 98.4°F | Resp 18 | Ht 76.0 in | Wt 228.2 lb

## 2018-10-21 DIAGNOSIS — I10 Essential (primary) hypertension: Secondary | ICD-10-CM | POA: Diagnosis not present

## 2018-10-21 MED ORDER — AMLODIPINE BESYLATE-VALSARTAN 5-320 MG PO TABS: 1.0000 | ORAL_TABLET | Freq: Every day | ORAL | 3 refills | Status: DC

## 2018-10-21 MED FILL — AMLODIPINE-VALSARTAN 5-320: 5-320 | 30 days supply | Qty: 30 | Fill #0

## 2018-10-21 NOTE — Progress Notes (Signed)
Chief Complaint  Patient presents with  . Hypertension    Pt states he has been checking BPs at home and have been between 140/100 to 120/80.    Subjective Kenneth Austin is a 48 y.o. male who presents for hypertension follow up. He does monitor home blood pressures. Blood pressures ranging from 120-140's/80-100's on average. He is compliant with medication- valsartan 320 mg/d. Patient has these side effects of medication: none He is usually adhering to a healthy diet overall. Current exercise: none   Past Medical History:  Diagnosis Date  . GERD (gastroesophageal reflux disease)   . History of chicken pox   . Poison ivy   . Shingles   . Sinus infection     Review of Systems Cardiovascular: no chest pain Respiratory:  no shortness of breath  Exam BP 110/80 (BP Location: Left Arm, Patient Position: Sitting, Cuff Size: Large)   Pulse (!) 103   Temp 98.4 F (36.9 C) (Oral)   Resp 18   Ht 6\' 4"  (1.93 m)   Wt 228 lb 3.2 oz (103.5 kg)   SpO2 96%   BMI 27.78 kg/m  General:  well developed, well nourished, in no apparent distress Heart: RRR, no bruits, no LE edema Lungs: clear to auscultation, no accessory muscle use Psych: well oriented with normal range of affect and appropriate judgment/insight  Essential hypertension - Plan: amLODipine-valsartan (EXFORGE) 5-320 MG tablet  Orders as above. Counseled on diet and exercise. F/u in 1 mo to reck. The patient voiced understanding and agreement to the plan.  [place], DO 10/21/18  8:51 AM

## 2018-10-21 NOTE — Patient Instructions (Signed)
Keep the diet clean and stay active.  Aim to do some physical exertion for 150 minutes per week. This is typically divided into 5 days per week, 30 minutes per day. The activity should be enough to get your heart rate up. Anything is better than nothing if you have time constraints.  Keep checking blood pressures at home.  Let me know if medicine is too expensive.  Let us know if you need anything.

## 2018-11-24 ENCOUNTER — Encounter: Payer: Self-pay | Admitting: Family Medicine

## 2018-11-24 ENCOUNTER — Other Ambulatory Visit: Payer: Self-pay

## 2018-11-24 ENCOUNTER — Ambulatory Visit (INDEPENDENT_AMBULATORY_CARE_PROVIDER_SITE_OTHER): Payer: 59 | Admitting: Family Medicine

## 2018-11-24 ENCOUNTER — Ambulatory Visit: Payer: 59 | Admitting: Family Medicine

## 2018-11-24 DIAGNOSIS — I10 Essential (primary) hypertension: Secondary | ICD-10-CM | POA: Diagnosis not present

## 2018-11-24 MED ORDER — AMLODIPINE BESYLATE-VALSARTAN 10-320 MG PO TABS: 1.0000 | ORAL_TABLET | Freq: Every day | ORAL | 2 refills | Status: DC

## 2018-11-24 NOTE — Progress Notes (Signed)
Virtual Visit via Video Note  I connected with Kenneth Austin on 11/24/18 at  7:30 AM EDT by a video enabled telemedicine application and verified that I am speaking with the correct person using two identifiers.   I discussed the limitations of evaluation and management by telemedicine and the availability of in person appointments. The patient expressed understanding and agreed to proceed.  History of Present Illness: Hypertension Patient presents for hypertension follow up. He does monitor home blood pressures. Blood pressures ranging on average from 130's/high 80's. He is compliant with medication- Exforge 5-320 mg/d, amlodipine was recently added to valsartan. Patient has these side effects of medication: none He is typically adhering to a healthy diet overall. Exercise: starting to walk   Observations/Objective: No conversational dyspnea Age appropriate judgment and insight Nml affect and mood  Assessment and Plan: Essential hypertension - Plan: amLODipine-valsartan (EXFORGE) 10-320 MG tablet  Increase amlodipine portion, maintain valsartan 320 dosage. Counseled on diet and exercise. I think he would benefit from slightly lower av BP.   Follow Up Instructions: 3 mo   I discussed the assessment and treatment plan with the patient. The patient was provided an opportunity to ask questions and all were answered. The patient agreed with the plan and demonstrated an understanding of the instructions.   The patient was advised to call back or seek an in-person evaluation if the symptoms worsen or if the condition fails to improve as anticipated.  [place], DO

## 2018-12-14 ENCOUNTER — Encounter: Payer: Self-pay | Admitting: Family Medicine

## 2018-12-15 ENCOUNTER — Ambulatory Visit (INDEPENDENT_AMBULATORY_CARE_PROVIDER_SITE_OTHER): Payer: 59 | Admitting: Family Medicine

## 2018-12-15 ENCOUNTER — Other Ambulatory Visit: Payer: Self-pay

## 2018-12-15 ENCOUNTER — Encounter: Payer: Self-pay | Admitting: Family Medicine

## 2018-12-15 DIAGNOSIS — M7989 Other specified soft tissue disorders: Secondary | ICD-10-CM | POA: Diagnosis not present

## 2018-12-15 DIAGNOSIS — I10 Essential (primary) hypertension: Secondary | ICD-10-CM | POA: Diagnosis not present

## 2018-12-15 MED ORDER — AMLODIPINE BESYLATE-VALSARTAN 5-320 MG PO TABS: 1.0000 | ORAL_TABLET | Freq: Every day | ORAL | 2 refills | Status: DC

## 2018-12-15 NOTE — Progress Notes (Signed)
Chief Complaint  Patient presents with  . Leg Swelling    Subjective: Patient is a 48 y.o. male here for leg swelling. Due to COVID-19 pandemic, we are interacting via web portal for an electronic face-to-face visit. I verified patient's ID using 2 identifiers. Patient agreed to proceed with visit via this method. Patient is at home, I am at office. No other people present.   Pt is being tx'd for HTN. Started on Exforge, recently increased amlodipine dosage from 5 mg/d to 10 mg/d. 1 week ago, he started experiencing worsening swelling in both of his legs. His BP had been improving. No change in diet, calf pain, sob otherwise.   ROS: Heart: Denies chest pain  Lungs: Denies SOB   Past Medical History:  Diagnosis Date  . GERD (gastroesophageal reflux disease)   . History of chicken pox   . Poison ivy   . Shingles   . Sinus infection     Objective: No conversational dyspnea Age appropriate judgment and insight Nml affect and mood Showed me his legs, pushed and evidence if pitting was observed  Assessment and Plan: Leg swelling  Essential hypertension - Plan: amLODipine-valsartan (EXFORGE) 5-320 MG tablet  Orders as above. Go back to 5 mg amlodipine. Ck BP's at home. If still elevated, will add HCTZ.  The patient voiced understanding and agreement to the plan.  [place], DO 12/15/18  10:57 AM

## 2019-09-06 ENCOUNTER — Other Ambulatory Visit: Payer: Self-pay | Admitting: Family Medicine

## 2019-09-06 DIAGNOSIS — I10 Essential (primary) hypertension: Secondary | ICD-10-CM

## 2020-04-10 ENCOUNTER — Encounter: Payer: 59 | Admitting: Family Medicine

## 2020-04-16 ENCOUNTER — Other Ambulatory Visit: Payer: Self-pay | Admitting: Family Medicine

## 2020-04-16 ENCOUNTER — Encounter: Payer: Self-pay | Admitting: Family Medicine

## 2020-04-16 ENCOUNTER — Other Ambulatory Visit: Payer: Self-pay

## 2020-04-16 ENCOUNTER — Ambulatory Visit (INDEPENDENT_AMBULATORY_CARE_PROVIDER_SITE_OTHER): Payer: 59 | Admitting: Family Medicine

## 2020-04-16 VITALS — BP 120/78 | HR 82 | Temp 98.7°F | Ht 76.0 in | Wt 244.1 lb

## 2020-04-16 DIAGNOSIS — Z1159 Encounter for screening for other viral diseases: Secondary | ICD-10-CM

## 2020-04-16 DIAGNOSIS — R0600 Dyspnea, unspecified: Secondary | ICD-10-CM

## 2020-04-16 DIAGNOSIS — Z125 Encounter for screening for malignant neoplasm of prostate: Secondary | ICD-10-CM | POA: Diagnosis not present

## 2020-04-16 DIAGNOSIS — R0789 Other chest pain: Secondary | ICD-10-CM | POA: Diagnosis not present

## 2020-04-16 DIAGNOSIS — Z114 Encounter for screening for human immunodeficiency virus [HIV]: Secondary | ICD-10-CM | POA: Diagnosis not present

## 2020-04-16 DIAGNOSIS — F5221 Male erectile disorder: Secondary | ICD-10-CM | POA: Diagnosis not present

## 2020-04-16 DIAGNOSIS — Z0000 Encounter for general adult medical examination without abnormal findings: Secondary | ICD-10-CM

## 2020-04-16 DIAGNOSIS — R0609 Other forms of dyspnea: Secondary | ICD-10-CM

## 2020-04-16 MED ORDER — SILDENAFIL CITRATE 20 MG PO TABS: ORAL_TABLET | ORAL | 3 refills | Status: DC

## 2020-04-16 MED FILL — SILDENAFIL CITRATE 20 MG TA: 20 | 30 days supply | Qty: 30 | Fill #0

## 2020-04-16 NOTE — Patient Instructions (Addendum)
Give [place] 2-3 business days to get the results of your labs back.   Keep the diet clean and stay active.  I recommend getting the flu shot in mid October. This suggestion would change if the CDC comes out with a different recommendation.   If you do not hear anything about your referral in the next 1-2 weeks, call our office and ask for an update.  Let us know if you need anything.

## 2020-04-16 NOTE — Progress Notes (Signed)
Chief Complaint  Patient presents with  . Annual Exam    heart rate increased    Well Male Kenneth Austin is here for a complete physical.   His last physical was >1 year ago.  Current diet: in general, a "healthy" diet.  Current exercise: some wlaking Weight trend: increased Fatigue out of ordinary? He has fatigue Seat belt? Yes.    Randomly wlil get increased HR. Notices he is fatigued doing rudimentary activities as well over past year. Takes several minutes to recover. +CP on L area. PGF had stroke in his 36's. Father passed away in accident at 56. He has a hx of high blood pressure. Compliant with meds. No hx of smoking. No real palliation/provocation for chest pain.   Health maintenance Tetanus- Yes HIV- No Hep C- No   Past Medical History:  Diagnosis Date  . GERD (gastroesophageal reflux disease)   . History of chicken pox   . Poison ivy   . Shingles   . Sinus infection       Past Surgical History:  Procedure Laterality Date  . TOOTH EXTRACTION    . WISDOM TOOTH EXTRACTION      Medications  Current Outpatient Medications on File Prior to Visit  Medication Sig Dispense Refill  . amLODipine-valsartan (EXFORGE) 5-320 MG tablet TAKE 1 TABLET BY MOUTH EVERY DAY.MAX ON INSURANCE 30 tablet 8  . Multiple Vitamin (MULTIVITAMIN) tablet Take 1 tablet by mouth daily.    . sildenafil (REVATIO) 20 MG tablet Take 20- 100 mg once daily as needed for ED 30 tablet 3   Allergies Allergies  Allergen Reactions  . Augmentin [Amoxicillin-Pot Clavulanate] Other (See Comments)    Abdominal pain and loose stools.     Family History Family History  Problem Relation Age of Onset  . Cancer Mother        lyposarcoma  . Hypertension Paternal Grandfather   . Stroke Paternal Grandfather   . Heart attack Maternal Grandmother     Review of Systems: Constitutional:  no fevers Eye:  no recent significant change in vision Ear/Nose/Mouth/Throat:  Ears:  no hearing  loss Nose/Mouth/Throat:  no complaints of nasal congestion, no sore throat Cardiovascular:  +chest pain Respiratory:  +shortness of breath as above Gastrointestinal:  no change in bowel habits GU:  Male: negative for dysuria, frequency Musculoskeletal/Extremities:  no joint pain Integumentary (Skin/Breast):  no abnormal skin lesions reported Neurologic:  no headaches Endocrine: No unexpected weight changes Hematologic/Lymphatic:  no abnormal bleeding  Exam BP 120/78 (BP Location: Left Arm, Patient Position: Sitting, Cuff Size: Large)   Pulse 82   Temp 98.7 F (37.1 C) (Oral)   Ht 6\' 4"  (1.93 m)   Wt 244 lb 2 oz (110.7 kg)   SpO2 99%   BMI 29.72 kg/m  General:  well developed, well nourished, in no apparent distress Skin:  no significant moles, warts, or growths Head:  no masses, lesions, or tenderness Eyes:  pupils equal and round, sclera anicteric without injection Ears:  canals without lesions, TMs shiny without retraction, no obvious effusion, no erythema Nose:  nares patent, septum midline, mucosa normal Throat/Pharynx:  lips and gingiva without lesion; tongue and uvula midline; non-inflamed pharynx; no exudates or postnasal drainage Neck: neck supple without adenopathy, thyromegaly, or masses Cardiac: RRR, no bruits, no LE edema Lungs:  clear to auscultation, breath sounds equal bilaterally, no respiratory distress Rectal: Deferred Musculoskeletal: no ttp over chest wall; symmetrical muscle groups noted without atrophy or deformity Neuro:  gait normal; deep tendon reflexes normal and symmetric Psych: well oriented with normal range of affect and appropriate judgment/insight  Assessment and Plan  Well adult exam - Plan: CBC, Comprehensive metabolic panel, Lipid panel, Testosterone, Hemoglobin A1c  Encounter for hepatitis C screening test for low risk patient - Plan: Hepatitis C antibody  Screening for HIV (human immunodeficiency virus) - Plan: HIV Antibody (routine  testing w rflx)  Screening for malignant neoplasm of prostate - Plan: PSA  DOE (dyspnea on exertion) - Plan: Ambulatory referral to Cardiology, EKG 12-Lead  Atypical chest pain - Plan: Ambulatory referral to Cardiology, EKG 12-Lead  Erectile disorder, acquired, generalized, moderate - Plan: sildenafil (REVATIO) 20 MG tablet   Well 49 y.o. male. Counseled on diet and exercise. Counseled on risks and benefits of prostate cancer screening with PSA. The patient agrees to [facility] testing. EKG shows NSR, nml axis, no interval abn, no signs of ischemia, good R wave progression.  Will get him set up with cardiology team. Immunizations, labs, and further orders as above. Follow up pending above workup. The patient voiced understanding and agreement to the plan.  [place], DO 04/16/20 12:30 PM

## 2020-04-17 LAB — COMPREHENSIVE METABOLIC PANEL
AG Ratio: 2 (calc) (ref 1.0–2.5)
ALT: 78 U/L — ABNORMAL HIGH (ref 9–46)
AST: 36 U/L (ref 10–40)
Albumin: 4.5 g/dL (ref 3.6–5.1)
Alkaline phosphatase (APISO): 94 U/L (ref 36–130)
BUN: 14 mg/dL (ref 7–25)
CO2: 26 mmol/L (ref 20–32)
Calcium: 9.5 mg/dL (ref 8.6–10.3)
Chloride: 102 mmol/L (ref 98–110)
Creat: 1.22 mg/dL (ref 0.60–1.35)
Globulin: 2.2 g/dL (calc) (ref 1.9–3.7)
Glucose, Bld: 103 mg/dL — ABNORMAL HIGH (ref 65–99)
Potassium: 4.5 mmol/L (ref 3.5–5.3)
Sodium: 140 mmol/L (ref 135–146)
Total Bilirubin: 0.3 mg/dL (ref 0.2–1.2)
Total Protein: 6.7 g/dL (ref 6.1–8.1)

## 2020-04-17 LAB — HEPATITIS C ANTIBODY
Hepatitis C Ab: NONREACTIVE
SIGNAL TO CUT-OFF: 0.01 (ref <1.00)

## 2020-04-17 LAB — CBC
HCT: 43.9 % (ref 38.5–50.0)
Hemoglobin: 15.1 g/dL (ref 13.2–17.1)
MCH: 32.3 pg (ref 27.0–33.0)
MCHC: 34.4 g/dL (ref 32.0–36.0)
MCV: 93.8 fL (ref 80.0–100.0)
MPV: 11.4 fL (ref 7.5–12.5)
Platelets: 215 10*3/uL (ref 140–400)
RBC: 4.68 10*6/uL (ref 4.20–5.80)
RDW: 12 % (ref 11.0–15.0)
WBC: 4.3 10*3/uL (ref 3.8–10.8)

## 2020-04-17 LAB — LIPID PANEL
Cholesterol: 178 mg/dL (ref <200)
HDL: 42 mg/dL (ref >=40)
LDL Cholesterol (Calc): 89 mg/dL (calc)
Non-HDL Cholesterol (Calc): 136 mg/dL (calc) — ABNORMAL HIGH (ref <130)
Total CHOL/HDL Ratio: 4.2 (calc) (ref <5.0)
Triglycerides: 356 mg/dL — ABNORMAL HIGH (ref <150)

## 2020-04-17 LAB — TESTOSTERONE: Testosterone: 282 ng/dL (ref 250–827)

## 2020-04-17 LAB — PSA: PSA: 0.6 ng/mL (ref <=4.0)

## 2020-04-17 LAB — HEMOGLOBIN A1C
Hgb A1c MFr Bld: 6 % of total Hgb — ABNORMAL HIGH (ref <5.7)
Mean Plasma Glucose: 126 (calc)
eAG (mmol/L): 7 (calc)

## 2020-04-17 LAB — HIV ANTIBODY (ROUTINE TESTING W REFLEX): HIV 1&2 Ab, 4th Generation: NONREACTIVE

## 2020-04-18 ENCOUNTER — Other Ambulatory Visit: Payer: Self-pay | Admitting: Family Medicine

## 2020-04-18 ENCOUNTER — Other Ambulatory Visit: Payer: Self-pay

## 2020-04-18 ENCOUNTER — Ambulatory Visit: Payer: 59 | Admitting: Cardiology

## 2020-04-18 ENCOUNTER — Encounter: Payer: Self-pay | Admitting: Cardiology

## 2020-04-18 VITALS — BP 134/100 | HR 98 | Ht 76.0 in | Wt 240.8 lb

## 2020-04-18 DIAGNOSIS — R079 Chest pain, unspecified: Secondary | ICD-10-CM | POA: Diagnosis not present

## 2020-04-18 DIAGNOSIS — R072 Precordial pain: Secondary | ICD-10-CM | POA: Diagnosis not present

## 2020-04-18 DIAGNOSIS — I10 Essential (primary) hypertension: Secondary | ICD-10-CM | POA: Diagnosis not present

## 2020-04-18 DIAGNOSIS — R7989 Other specified abnormal findings of blood chemistry: Secondary | ICD-10-CM

## 2020-04-18 DIAGNOSIS — R0600 Dyspnea, unspecified: Secondary | ICD-10-CM

## 2020-04-18 DIAGNOSIS — R0609 Other forms of dyspnea: Secondary | ICD-10-CM

## 2020-04-18 DIAGNOSIS — E785 Hyperlipidemia, unspecified: Secondary | ICD-10-CM

## 2020-04-18 MED ORDER — ROSUVASTATIN CALCIUM 20 MG PO TABS
20.0000 mg | ORAL_TABLET | Freq: Every day | ORAL | 3 refills | Status: DC
Start: 2020-04-18 — End: 2020-07-16

## 2020-04-18 MED ORDER — HYDROCHLOROTHIAZIDE 12.5 MG PO CAPS: 12.5000 mg | ORAL_CAPSULE | Freq: Every day | ORAL | 3 refills | Status: DC

## 2020-04-18 MED ORDER — METOPROLOL TARTRATE 100 MG PO TABS: ORAL_TABLET | ORAL | 0 refills | Status: DC

## 2020-04-18 NOTE — Patient Instructions (Signed)
Medication Instructions:   START HCTZ 12.5 MG ONCE DAILY  *If you need a refill on your cardiac medications before your next appointment, please call your pharmacy*   Lab Work:  Your physician recommends that you return for lab work in: [place]  If you have labs (blood work) drawn today and your tests are completely normal, you will receive your results only by: Marland Kitchen MyChart Message (if you have MyChart) OR . A paper copy in the mail If you have any lab test that is abnormal or we need to change your treatment, we will call you to review the results.   Testing/Procedures:  Your physician has requested that you have an echocardiogram. Echocardiography is a painless test that uses sound waves to create images of your heart. It provides your doctor with information about the size and shape of your heart and how well your heart's chambers and valves are working. This procedure takes approximately one hour. There are no restrictions for this procedure.[facility] [facility]-1ST FLOOR IMAGING DEPARTMENT   Your cardiac CT will be scheduled at one of the below locations:   [facility] [address] [place], [place] [address] [phone]  If scheduled at [facility], please arrive at the [facility] main entrance of [facility] 30 minutes prior to test start time. Proceed to the [facility] Radiology Department (first floor) to check-in and test prep.  Please follow these instructions carefully (unless otherwise directed):  Hold all erectile dysfunction medications at least 3 days (72 hrs) prior to test.  On the Night Before the Test: . Be sure to Drink plenty of water. . Do not consume any caffeinated/decaffeinated beverages or chocolate 12 hours prior to your test. . Do not take any antihistamines 12 hours prior to your test.   On the Day of the Test: . Drink plenty of water. Do not drink any water within one hour of the test. . Do not eat any food 4 hours  prior to the test. . You may take your regular medications prior to the test.  . Take metoprolol (Lopressor) 100 MG two hours prior to test. . HOLD Furosemide/Hydrochlorothiazide morning of the test.       After the Test: . Drink plenty of water. . After receiving IV contrast, you may experience a mild flushed feeling. This is normal. . On occasion, you may experience a mild rash up to 24 hours after the test. This is not dangerous. If this occurs, you can take Benadryl 25 mg and increase your fluid intake. . If you experience trouble breathing, this can be serious. If it is severe call 911 IMMEDIATELY. If it is mild, please call our office. . If you take any of these medications: Glipizide/Metformin, Avandament, Glucavance, please do not take 48 hours after completing test unless otherwise instructed.   Once we have confirmed authorization from your insurance company, we will call you to set up a date and time for your test. Based on how quickly your insurance processes prior authorizations requests, please allow up to 4 weeks to be contacted for scheduling your Cardiac CT appointment. Be advised that routine Cardiac CT appointments could be scheduled as many as 8 weeks after your provider has ordered it.  For non-scheduling related questions, please contact the cardiac imaging nurse navigator should you have any questions/concerns: Marchia Bond, Cardiac Imaging Nurse Navigator Burley Saver, Interim Cardiac Imaging Nurse [facility] Direct Office Dial: [phone]  For scheduling needs, including cancellations and rescheduling, please call Vivien Rota at [phone], option [phone].     Follow-Up: At [facility], you and your health needs are our priority.  As part of our continuing mission to provide you with exceptional heart care, we have created designated Provider Care Teams.  These Care Teams include your primary Cardiologist (physician) and Advanced  Practice Providers (APPs -  Physician Assistants and Nurse Practitioners) who all work together to provide you with the care you need, when you need it.  We recommend signing up for the patient portal called "MyChart".  Sign up information is provided on this After Visit Summary.  MyChart is used to connect with patients for Virtual Visits (Telemedicine).  Patients are able to view lab/test results, encounter notes, upcoming appointments, etc.  Non-urgent messages can be sent to your provider as well.   To learn more about what you can do with MyChart, go to [email or web address].    Your next appointment:   6 month(s)  The format for your next appointment:   In Person  Provider:   Kirk Ruths, MD

## 2020-04-18 NOTE — Progress Notes (Signed)
Referring-Kenneth Wendling, DO Reason for referral-chest pain and dyspnea  HPI: 49 year old male for evaluation of chest pain and dyspnea at request of Riki Sheer, DO.  For the past 1 year he has noticed increased dyspnea on exertion as well as elevation in heart rate with activities.  His heart rate will be up to 170-180.  He denies orthopnea, PND, pedal edema.  For the past 2 weeks he has had pain in the left upper chest and left arm.  It has been persistent.  It can increase with inspiration.  He does feel his episodes of tachycardia as well.  Because of the above cardiology asked to evaluate.  Current Outpatient Medications  Medication Sig Dispense Refill  . amLODipine-valsartan (EXFORGE) 5-320 MG tablet TAKE 1 TABLET BY MOUTH EVERY DAY.MAX ON INSURANCE 30 tablet 8  . Multiple Vitamin (MULTIVITAMIN) tablet Take 1 tablet by mouth daily.    . sildenafil (REVATIO) 20 MG tablet Take 20- 100 mg once daily as needed for ED 30 tablet 3  . hydrochlorothiazide (MICROZIDE) 12.5 MG capsule Take 1 capsule (12.5 mg total) by mouth daily. 90 capsule 3   No current facility-administered medications for this visit.    Allergies  Allergen Reactions  . Augmentin [Amoxicillin-Pot Clavulanate] Other (See Comments)    Abdominal pain and loose stools.      Past Medical History:  Diagnosis Date  . GERD (gastroesophageal reflux disease)   . History of chicken pox   . Hypertension   . Hypertriglyceridemia   . Poison ivy   . Shingles   . Sinus infection     Past Surgical History:  Procedure Laterality Date  . TOOTH EXTRACTION    . WISDOM TOOTH EXTRACTION      Social History   Socioeconomic History  . Marital status: Married    Spouse name: Not on file  . Number of children: 2  . Years of education: Not on file  . Highest education level: Not on file  Occupational History  . Not on file  Tobacco Use  . Smoking status: Never Smoker  . Smokeless tobacco: Never Used  Substance  and Sexual Activity  . Alcohol use: Yes    Comment: socially  . Drug use: No  . Sexual activity: Yes  Other Topics Concern  . Not on file  Social History Narrative  . Not on file   Social Determinants of Health   Financial Resource Strain:   . Difficulty of Paying Living Expenses: Not on file  Food Insecurity:   . Worried About Charity fundraiser in the Last Year: Not on file  . Ran Out of Food in the Last Year: Not on file  Transportation Needs:   . Lack of Transportation (Medical): Not on file  . Lack of Transportation (Non-Medical): Not on file  Physical Activity:   . Days of Exercise per Week: Not on file  . Minutes of Exercise per Session: Not on file  Stress:   . Feeling of Stress : Not on file  Social Connections:   . Frequency of Communication with Friends and Family: Not on file  . Frequency of Social Gatherings with Friends and Family: Not on file  . Attends Religious Services: Not on file  . Active Member of Clubs or Organizations: Not on file  . Attends Archivist Meetings: Not on file  . Marital Status: Not on file  Intimate Partner Violence:   . Fear of Current or Ex-Partner: Not on file  .  Emotionally Abused: Not on file  . Physically Abused: Not on file  . Sexually Abused: Not on file    Family History  Problem Relation Age of Onset  . Cancer Mother        lyposarcoma  . Hypertension Paternal Grandfather   . Stroke Paternal Grandfather   . Heart attack Maternal Grandmother     ROS: no fevers or chills, productive cough, hemoptysis, dysphasia, odynophagia, melena, hematochezia, dysuria, hematuria, rash, seizure activity, orthopnea, PND, pedal edema, claudication. Remaining systems are negative.  Physical Exam:   Blood pressure (!) 134/100, pulse 98, height 6\' 4"  (1.93 m), weight 240 lb 12.8 oz (109.2 kg), SpO2 99 %.  General:  Well developed/well nourished in NAD Skin warm/dry Patient not depressed No peripheral  clubbing Back-normal HEENT-normal/normal eyelids Neck supple/normal carotid upstroke bilaterally; no bruits; no JVD; no thyromegaly chest - CTA/ normal expansion CV - RRR/normal S1 and S2; no murmurs, rubs or gallops;  PMI nondisplaced Abdomen -NT/ND, no HSM, no mass, + bowel sounds, no bruit 2+ femoral pulses, no bruits Ext-no edema, chords, 2+ DP Neuro-grossly nonfocal  ECG -April 16, 2020-sinus rhythm, incomplete right bundle branch block.  Personally reviewed  A/P  1 dyspnea on exertion-etiology unclear.  We will arrange an echocardiogram to assess LV function.  Schedule cardiac CTA to exclude coronary disease.  2 chest pain-symptoms are atypical.  Cardiac CTA to rule out coronary disease.  3 tachycardia-patient has an apple watch.  He will track his heart rate at home and perform rhythm strip as indicated.  4 hypertension-patient's blood pressure is elevated.  Add HCTZ 12.5 mg daily.  Check potassium and renal function in 1 week.  Increase medications as needed.  Kirk Ruths, MD

## 2020-04-25 ENCOUNTER — Other Ambulatory Visit: Payer: Self-pay | Admitting: Family Medicine

## 2020-04-25 DIAGNOSIS — I10 Essential (primary) hypertension: Secondary | ICD-10-CM

## 2020-05-03 LAB — BASIC METABOLIC PANEL
BUN/Creatinine Ratio: 14 (ref 9–20)
BUN: 16 mg/dL (ref 6–24)
CO2: 25 mmol/L (ref 20–29)
Calcium: 9.8 mg/dL (ref 8.7–10.2)
Chloride: 99 mmol/L (ref 96–106)
Creatinine, Ser: 1.17 mg/dL (ref 0.76–1.27)
GFR calc Af Amer: 85 mL/min/{1.73_m2} (ref >59)
GFR calc non Af Amer: 74 mL/min/{1.73_m2} (ref >59)
Glucose: 108 mg/dL — ABNORMAL HIGH (ref 65–99)
Potassium: 4.6 mmol/L (ref 3.5–5.2)
Sodium: 140 mmol/L (ref 134–144)

## 2020-05-16 ENCOUNTER — Other Ambulatory Visit: Payer: Self-pay

## 2020-05-16 ENCOUNTER — Telehealth (HOSPITAL_COMMUNITY): Payer: Self-pay | Admitting: Emergency Medicine

## 2020-05-16 ENCOUNTER — Ambulatory Visit (HOSPITAL_BASED_OUTPATIENT_CLINIC_OR_DEPARTMENT_OTHER)
Admission: RE | Admit: 2020-05-16 | Discharge: 2020-05-16 | Disposition: A | Discharge status: Home / Self Care | Payer: 59 | Source: Ambulatory Visit | Attending: Cardiology | Admitting: Cardiology

## 2020-05-16 DIAGNOSIS — R0609 Other forms of dyspnea: Secondary | ICD-10-CM

## 2020-05-16 DIAGNOSIS — R0600 Dyspnea, unspecified: Secondary | ICD-10-CM | POA: Insufficient documentation

## 2020-05-16 DIAGNOSIS — R079 Chest pain, unspecified: Secondary | ICD-10-CM

## 2020-05-16 LAB — ECHOCARDIOGRAM COMPLETE: Area-P 1/2: 3.23 cm2

## 2020-05-16 NOTE — Telephone Encounter (Signed)
Reaching out to patient to offer assistance regarding upcoming cardiac imaging study; pt verbalizes understanding of appt date/time, parking situation and where to check in, pre-test NPO status and medications ordered, and verified current allergies; name and call back number provided for further questions should they arise Kenneth Bond RN Navigator Cardiac Imaging [facility] [phone] office [phone] cell  Pt instructed to hold revatio and HCTZ; pt taking 100mg  metop 2 hr prior to scan

## 2020-05-17 ENCOUNTER — Encounter (HOSPITAL_COMMUNITY): Payer: Self-pay

## 2020-05-17 ENCOUNTER — Ambulatory Visit (HOSPITAL_COMMUNITY)
Admission: RE | Admit: 2020-05-17 | Discharge: 2020-05-17 | Disposition: A | Discharge status: Home / Self Care | Payer: 59 | Source: Ambulatory Visit | Attending: Cardiology | Admitting: Cardiology

## 2020-05-17 DIAGNOSIS — R072 Precordial pain: Secondary | ICD-10-CM | POA: Diagnosis present

## 2020-05-17 MED ORDER — METOPROLOL TARTRATE 5 MG/5ML IV SOLN
5.0000 mg | INTRAVENOUS | Status: DC | PRN
  Administered 2020-05-17 (×3): 5 mg via INTRAVENOUS

## 2020-05-17 MED ORDER — NITROGLYCERIN 0.4 MG SL SUBL
SUBLINGUAL_TABLET | SUBLINGUAL | Status: AC
  Filled 2020-05-17: qty 2

## 2020-05-17 MED ORDER — DILTIAZEM HCL 25 MG/5ML IV SOLN
5.0000 mg | Freq: Once | INTRAVENOUS | Status: AC
  Administered 2020-05-17: 5 mg via INTRAVENOUS
  Filled 2020-05-17: qty 5

## 2020-05-17 MED ORDER — NITROGLYCERIN 0.4 MG SL SUBL: 0.8000 mg | SUBLINGUAL_TABLET | Freq: Once | SUBLINGUAL | Status: DC

## 2020-05-17 MED ORDER — DILTIAZEM HCL 25 MG/5ML IV SOLN
INTRAVENOUS | Status: AC
  Filled 2020-05-17: qty 5

## 2020-05-17 MED ORDER — METOPROLOL TARTRATE 5 MG/5ML IV SOLN
INTRAVENOUS | Status: AC
  Filled 2020-05-17: qty 15

## 2020-05-17 NOTE — Progress Notes (Signed)
CT scan cancelled by Dr. Radford Pax due to high heart rate. Pt D/C home ambulatory with wife, awake and alert. In no distress

## 2020-05-18 ENCOUNTER — Telehealth: Payer: Self-pay | Admitting: *Deleted

## 2020-05-18 DIAGNOSIS — R072 Precordial pain: Secondary | ICD-10-CM

## 2020-05-18 NOTE — Telephone Encounter (Signed)
-----   Message from Lelon Perla, MD sent at 05/18/2020  7:40 AM EDT ----- I will ask Zain Lankford to schedule stress nuclear study instead Thx  Kirk Ruths ----- Message ----- From: Lorenza Evangelist, RN Sent: 05/17/2020   4:23 PM EDT To: Sueanne Margarita, MD, Roosvelt Maser, #  Dr. Stanford Breed, This patient came in for his CCTA and took PO meds appropriately however HR remained high even after IV meds were attempted.  Do you want to r/s with different medications (ie ivabradine)? Thanks, Marchia Bond

## 2020-05-18 NOTE — Telephone Encounter (Signed)
Spoke with pt, aware of testing and need for covid test prior. Order placed for exercise nuclear test.

## 2020-05-21 ENCOUNTER — Encounter: Payer: Self-pay | Admitting: *Deleted

## 2020-05-21 ENCOUNTER — Other Ambulatory Visit (HOSPITAL_COMMUNITY)
Admission: RE | Admit: 2020-05-21 | Discharge: 2020-05-21 | Disposition: A | Discharge status: Home / Self Care | Payer: 59 | Source: Ambulatory Visit | Attending: Cardiology | Admitting: Cardiology

## 2020-05-21 ENCOUNTER — Telehealth (HOSPITAL_COMMUNITY): Payer: Self-pay | Admitting: *Deleted

## 2020-05-21 DIAGNOSIS — Z20822 Contact with and (suspected) exposure to covid-19: Secondary | ICD-10-CM | POA: Diagnosis not present

## 2020-05-21 DIAGNOSIS — Z01812 Encounter for preprocedural laboratory examination: Secondary | ICD-10-CM | POA: Insufficient documentation

## 2020-05-21 LAB — SARS CORONAVIRUS 2 (TAT 6-24 HRS): SARS Coronavirus 2: NEGATIVE

## 2020-05-21 NOTE — Telephone Encounter (Signed)
Patient given detailed instructions per Myocardial Perfusion Study Information Sheet for the test on 05/23/20. Patient notified to arrive 15 minutes early and that it is imperative to arrive on time for appointment to keep from having the test rescheduled.  If you need to cancel or reschedule your appointment, please call the office within 24 hours of your appointment. . Patient verbalized understanding. Kirstie Peri

## 2020-05-23 ENCOUNTER — Ambulatory Visit (HOSPITAL_COMMUNITY): Payer: 59 | Attending: Internal Medicine

## 2020-05-23 ENCOUNTER — Other Ambulatory Visit: Payer: Self-pay

## 2020-05-23 DIAGNOSIS — R072 Precordial pain: Secondary | ICD-10-CM | POA: Diagnosis not present

## 2020-05-23 LAB — MYOCARDIAL PERFUSION IMAGING
Estimated workload: 10.1 METS
Exercise duration (min): 8 min
Exercise duration (sec): 1 s
LV dias vol: 88 mL (ref 62–150)
LV sys vol: 43 mL
MPHR: 173 {beats}/min
Peak HR: 173 {beats}/min
Percent HR: 100 %
RPE: 18
Rest HR: 80 {beats}/min
SDS: 1
SRS: 0
SSS: 1
TID: 0.95

## 2020-05-23 MED ORDER — TECHNETIUM TC 99M TETROFOSMIN IV KIT
10.9000 | PACK | Freq: Once | INTRAVENOUS | Status: AC | PRN
  Administered 2020-05-23: 10.9 via INTRAVENOUS
  Filled 2020-05-23: qty 11

## 2020-05-23 MED ORDER — TECHNETIUM TC 99M TETROFOSMIN IV KIT
32.4000 | PACK | Freq: Once | INTRAVENOUS | Status: AC | PRN
  Administered 2020-05-23: 32.4 via INTRAVENOUS
  Filled 2020-05-23: qty 33

## 2020-05-25 ENCOUNTER — Other Ambulatory Visit: Payer: Self-pay | Admitting: Family Medicine

## 2020-05-25 ENCOUNTER — Ambulatory Visit: Payer: 59 | Admitting: Family Medicine

## 2020-05-25 ENCOUNTER — Other Ambulatory Visit: Payer: Self-pay

## 2020-05-25 ENCOUNTER — Encounter: Payer: Self-pay | Admitting: Family Medicine

## 2020-05-25 VITALS — BP 112/72 | HR 102 | Temp 98.6°F | Ht 76.0 in | Wt 241.2 lb

## 2020-05-25 DIAGNOSIS — M545 Low back pain, unspecified: Secondary | ICD-10-CM | POA: Diagnosis not present

## 2020-05-25 MED ORDER — CYCLOBENZAPRINE HCL 10 MG PO TABS: 5.0000 mg | ORAL_TABLET | Freq: Three times a day (TID) | ORAL | 0 refills | Status: DC | PRN

## 2020-05-25 MED FILL — CYCLOBENZAPRINE HCL 10 MG T: 10 | 7 days supply | Qty: 21 | Fill #0

## 2020-05-25 NOTE — Patient Instructions (Addendum)
Continue with Aleve as needed.  Ice/cold pack over area for 10-15 min twice daily.  Heat (pad or rice pillow in microwave) over affected area, 10-15 minutes twice daily.   Take Flexeril (cyclobenzaprine) 1-2 hours before planned bedtime. If it makes you drowsy, do not take during the day. You can try half a tab the following night.  Let us know if you need anything.  EXERCISES  RANGE OF MOTION (ROM) AND STRETCHING EXERCISES - Low Back Pain Most people with lower back pain will find that their symptoms get worse with excessive bending forward (flexion) or arching at the lower back (extension). The exercises that will help resolve your symptoms will focus on the opposite motion.  If you have pain, numbness or tingling which travels down into your buttocks, leg or foot, the goal of the therapy is for these symptoms to move closer to your back and eventually resolve. Sometimes, these leg symptoms will get better, but your lower back pain may worsen. This is often an indication of progress in your rehabilitation. Be very alert to any changes in your symptoms and the activities in which you participated in the 24 hours prior to the change. Sharing this information with your caregiver will allow him or her to most efficiently treat your condition. These exercises may help you when beginning to rehabilitate your injury. Your symptoms may resolve with or without further involvement from your physician, physical therapist or athletic trainer. While completing these exercises, remember:   Restoring tissue flexibility helps normal motion to return to the joints. This allows healthier, less painful movement and activity.  An effective stretch should be held for at least 30 seconds.  A stretch should never be painful. You should only feel a gentle lengthening or release in the stretched tissue. FLEXION RANGE OF MOTION AND STRETCHING EXERCISES:  STRETCH - Flexion, Single Knee to Chest   Lie on a firm bed  or floor with both legs extended in front of you.  Keeping one leg in contact with the floor, bring your opposite knee to your chest. Hold your leg in place by either grabbing behind your thigh or at your knee.  Pull until you feel a gentle stretch in your low back. Hold 30 seconds.  Slowly release your grasp and repeat the exercise with the opposite side. Repeat 2 times. Complete this exercise 3 times per week.   STRETCH - Flexion, Double Knee to Chest  Lie on a firm bed or floor with both legs extended in front of you.  Keeping one leg in contact with the floor, bring your opposite knee to your chest.  Tense your stomach muscles to support your back and then lift your other knee to your chest. Hold your legs in place by either grabbing behind your thighs or at your knees.  Pull both knees toward your chest until you feel a gentle stretch in your low back. Hold 30 seconds.  Tense your stomach muscles and slowly return one leg at a time to the floor. Repeat 2 times. Complete this exercise 3 times per week.   STRETCH - Low Trunk Rotation  Lie on a firm bed or floor. Keeping your legs in front of you, bend your knees so they are both pointed toward the ceiling and your feet are flat on the floor.  Extend your arms out to the side. This will stabilize your upper body by keeping your shoulders in contact with the floor.  Gently and slowly drop both knees  together to one side until you feel a gentle stretch in your low back. Hold for 30 seconds.  Tense your stomach muscles to support your lower back as you bring your knees back to the starting position. Repeat the exercise to the other side. Repeat 2 times. Complete this exercise at least 3 times per week.   EXTENSION RANGE OF MOTION AND FLEXIBILITY EXERCISES:  STRETCH - Extension, Prone on Elbows   Lie on your stomach on the floor, a bed will be too soft. Place your palms about shoulder width apart and at the height of your  head.  Place your elbows under your shoulders. If this is too painful, stack pillows under your chest.  Allow your body to relax so that your hips drop lower and make contact more completely with the floor.  Hold this position for 30 seconds.  Slowly return to lying flat on the floor. Repeat 2 times. Complete this exercise 3 times per week.   RANGE OF MOTION - Extension, Prone Press Ups  Lie on your stomach on the floor, a bed will be too soft. Place your palms about shoulder width apart and at the height of your head.  Keeping your back as relaxed as possible, slowly straighten your elbows while keeping your hips on the floor. You may adjust the placement of your hands to maximize your comfort. As you gain motion, your hands will come more underneath your shoulders.  Hold this position 30 seconds.  Slowly return to lying flat on the floor. Repeat 2 times. Complete this exercise 3 times per week.   RANGE OF MOTION- Quadruped, Neutral Spine   Assume a hands and knees position on a firm surface. Keep your hands under your shoulders and your knees under your hips. You may place padding under your knees for comfort.  Drop your head and point your tailbone toward the ground below you. This will round out your lower back like an angry cat. Hold this position for 30 seconds.  Slowly lift your head and release your tail bone so that your back sags into a large arch, like an old horse.  Hold this position for 30 seconds.  Repeat this until you feel limber in your low back.  Now, find your "sweet spot." This will be the most comfortable position somewhere between the two previous positions. This is your neutral spine. Once you have found this position, tense your stomach muscles to support your low back.  Hold this position for 30 seconds. Repeat 2 times. Complete this exercise 3 times per week.   STRENGTHENING EXERCISES - Low Back Sprain These exercises may help you when beginning to  rehabilitate your injury. These exercises should be done near your "sweet spot." This is the neutral, low-back arch, somewhere between fully rounded and fully arched, that is your least painful position. When performed in this safe range of motion, these exercises can be used for people who have either a flexion or extension based injury. These exercises may resolve your symptoms with or without further involvement from your physician, physical therapist or athletic trainer. While completing these exercises, remember:   Muscles can gain both the endurance and the strength needed for everyday activities through controlled exercises.  Complete these exercises as instructed by your physician, physical therapist or athletic trainer. Increase the resistance and repetitions only as guided.  You may experience muscle soreness or fatigue, but the pain or discomfort you are trying to eliminate should never worsen during these exercises.  If this pain does worsen, stop and make certain you are following the directions exactly. If the pain is still present after adjustments, discontinue the exercise until you can discuss the trouble with your caregiver.  STRENGTHENING - Deep Abdominals, Pelvic Tilt   Lie on a firm bed or floor. Keeping your legs in front of you, bend your knees so they are both pointed toward the ceiling and your feet are flat on the floor.  Tense your lower abdominal muscles to press your low back into the floor. This motion will rotate your pelvis so that your tail bone is scooping upwards rather than pointing at your feet or into the floor. With a gentle tension and even breathing, hold this position for 3 seconds. Repeat 2 times. Complete this exercise 3 times per week.   STRENGTHENING - Abdominals, Crunches   Lie on a firm bed or floor. Keeping your legs in front of you, bend your knees so they are both pointed toward the ceiling and your feet are flat on the floor. Cross your arms over  your chest.  Slightly tip your chin down without bending your neck.  Tense your abdominals and slowly lift your trunk high enough to just clear your shoulder blades. Lifting higher can put excessive stress on the lower back and does not further strengthen your abdominal muscles.  Control your return to the starting position. Repeat 2 times. Complete this exercise 3 times per week.   STRENGTHENING - Quadruped, Opposite UE/LE Lift   Assume a hands and knees position on a firm surface. Keep your hands under your shoulders and your knees under your hips. You may place padding under your knees for comfort.  Find your neutral spine and gently tense your abdominal muscles so that you can maintain this position. Your shoulders and hips should form a rectangle that is parallel with the floor and is not twisted.  Keeping your trunk steady, lift your right hand no higher than your shoulder and then your left leg no higher than your hip. Make sure you are not holding your breath. Hold this position for 30 seconds.  Continuing to keep your abdominal muscles tense and your back steady, slowly return to your starting position. Repeat with the opposite arm and leg. Repeat 2 times. Complete this exercise 3 times per week.   STRENGTHENING - Abdominals and Quadriceps, Straight Leg Raise   Lie on a firm bed or floor with both legs extended in front of you.  Keeping one leg in contact with the floor, bend the other knee so that your foot can rest flat on the floor.  Find your neutral spine, and tense your abdominal muscles to maintain your spinal position throughout the exercise.  Slowly lift your straight leg off the floor about 6 inches for a count of 3, making sure to not hold your breath.  Still keeping your neutral spine, slowly lower your leg all the way to the floor. Repeat this exercise with each leg 2 times. Complete this exercise 3 times per week.  POSTURE AND BODY MECHANICS CONSIDERATIONS -  Low Back Sprain Keeping correct posture when sitting, standing or completing your activities will reduce the stress put on different body tissues, allowing injured tissues a chance to heal and limiting painful experiences. The following are general guidelines for improved posture.  While reading these guidelines, remember:  The exercises prescribed by your provider will help you have the flexibility and strength to maintain correct postures.  The correct posture  provides the best environment for your joints to work. All of your joints have less wear and tear when properly supported by a spine with good posture. This means you will experience a healthier, less painful body.  Correct posture must be practiced with all of your activities, especially prolonged sitting and standing. Correct posture is as important when doing repetitive low-stress activities (typing) as it is when doing a single heavy-load activity (lifting).  RESTING POSITIONS Consider which positions are most painful for you when choosing a resting position. If you have pain with flexion-based activities (sitting, bending, stooping, squatting), choose a position that allows you to rest in a less flexed posture. You would want to avoid curling into a fetal position on your side. If your pain worsens with extension-based activities (prolonged standing, working overhead), avoid resting in an extended position such as sleeping on your stomach. Most people will find more comfort when they rest with their spine in a more neutral position, neither too rounded nor too arched. Lying on a non-sagging bed on your side with a pillow between your knees, or on your back with a pillow under your knees will often provide some relief. Keep in mind, being in any one position for a prolonged period of time, no matter how correct your posture, can still lead to stiffness.  PROPER SITTING POSTURE In order to minimize stress and discomfort on your spine, you  must sit with correct posture. Sitting with good posture should be effortless for a healthy body. Returning to good posture is a gradual process. Many people can work toward this most comfortably by using various supports until they have the flexibility and strength to maintain this posture on their own. When sitting with proper posture, your ears will fall over your shoulders and your shoulders will fall over your hips. You should use the back of the chair to support your upper back. Your lower back will be in a neutral position, just slightly arched. You may place a small pillow or folded towel at the base of your lower back for  support.  When working at a desk, create an environment that supports good, upright posture. Without extra support, muscles tire, which leads to excessive strain on joints and other tissues. Keep these recommendations in mind:  CHAIR:  A chair should be able to slide under your desk when your back makes contact with the back of the chair. This allows you to work closely.  The chair's height should allow your eyes to be level with the upper part of your monitor and your hands to be slightly lower than your elbows.  BODY POSITION  Your feet should make contact with the floor. If this is not possible, use a foot rest.  Keep your ears over your shoulders. This will reduce stress on your neck and low back.  INCORRECT SITTING POSTURES  If you are feeling tired and unable to assume a healthy sitting posture, do not slouch or slump. This puts excessive strain on your back tissues, causing more damage and pain. Healthier options include:  Using more support, like a lumbar pillow.  Switching tasks to something that requires you to be upright or walking.  Talking a brief walk.  Lying down to rest in a neutral-spine position.  PROLONGED STANDING WHILE SLIGHTLY LEANING FORWARD  When completing a task that requires you to lean forward while standing in one place for a long  time, place either foot up on a stationary 2-4 inch high object  to help maintain the best posture. When both feet are on the ground, the lower back tends to lose its slight inward curve. If this curve flattens (or becomes too large), then the back and your other joints will experience too much stress, tire more quickly, and can cause pain.  CORRECT STANDING POSTURES Proper standing posture should be assumed with all daily activities, even if they only take a few moments, like when brushing your teeth. As in sitting, your ears should fall over your shoulders and your shoulders should fall over your hips. You should keep a slight tension in your abdominal muscles to brace your spine. Your tailbone should point down to the ground, not behind your body, resulting in an over-extended swayback posture.   INCORRECT STANDING POSTURES  Common incorrect standing postures include a forward head, locked knees and/or an excessive swayback. WALKING Walk with an upright posture. Your ears, shoulders and hips should all line-up.  PROLONGED ACTIVITY IN A FLEXED POSITION When completing a task that requires you to bend forward at your waist or lean over a low surface, try to find a way to stabilize 3 out of 4 of your limbs. You can place a hand or elbow on your thigh or rest a knee on the surface you are reaching across. This will provide you more stability, so that your muscles do not tire as quickly. By keeping your knees relaxed, or slightly bent, you will also reduce stress across your lower back. CORRECT LIFTING TECHNIQUES  DO :  Assume a wide stance. This will provide you more stability and the opportunity to get as close as possible to the object which you are lifting.  Tense your abdominals to brace your spine. Bend at the knees and hips. Keeping your back locked in a neutral-spine position, lift using your leg muscles. Lift with your legs, keeping your back straight.  Test the weight of unknown objects  before attempting to lift them.  Try to keep your elbows locked down at your sides in order get the best strength from your shoulders when carrying an object.     Always ask for help when lifting heavy or awkward objects. INCORRECT LIFTING TECHNIQUES DO NOT:   Lock your knees when lifting, even if it is a small object.  Bend and twist. Pivot at your feet or move your feet when needing to change directions.  Assume that you can safely pick up even a paperclip without proper posture.

## 2020-05-25 NOTE — Progress Notes (Signed)
Musculoskeletal Exam  Patient: Kenneth Austin DOB: 1971/06/16  DOS: 05/25/2020  SUBJECTIVE:  Chief Complaint:   Chief Complaint  Patient presents with  . Back Pain    Shahin Knierim is a 49 y.o.  male for evaluation and treatment of his back pain.   Onset:  2 days ago. No inj or change in activity.  Location: lower Character:  squeezing, tight, dull ache Progression of issue:  has slightly improved Associated symptoms: no bruising, redness or swelling Denies bowel/bladder incontinence or weakness Treatment: to date has been OTC NSAIDS.   Neurovascular symptoms: no  Past Medical History:  Diagnosis Date  . GERD (gastroesophageal reflux disease)   . History of chicken pox   . Hypertension   . Hypertriglyceridemia   . Poison ivy   . Shingles   . Sinus infection     Objective:  VITAL SIGNS: BP 112/72 (BP Location: Left Arm, Patient Position: Sitting, Cuff Size: Normal)   Pulse (!) 102   Temp 98.6 F (37 C) (Oral)   Ht 6\' 4"  (1.93 m)   Wt 241 lb 4 oz (109.4 kg)   SpO2 98%   BMI 29.37 kg/m  Constitutional: Well formed, well developed. No acute distress. HENT: Normocephalic, atraumatic.  Thorax & Lungs:  No accessory muscle use Skin: Warm. Dry. No erythema. No rash.  Musculoskeletal: low back.   Tenderness to palpation: yes over b/l lumbar parasp msc b/l Deformity: no Ecchymosis: no Straight leg test: negative for Poor hamstring flexibility b/l. Neurologic: Normal sensory function. No focal deficits noted. DTR's equal and symmetric in LE's. No clonus. Psychiatric: Normal mood. Age appropriate judgment and insight. Alert & oriented x 3.    Assessment:  Acute bilateral low back pain without sciatica - Plan: cyclobenzaprine (FLEXERIL) 10 MG tablet  Plan: Warnings about flexeril verbalized and written down. Stretches/exercises, heat, ice, Tylenol, NSAIDs. F/u prn. The patient voiced understanding and agreement to the plan.   [place],  DO 05/25/20  2:38 PM

## 2020-06-19 ENCOUNTER — Other Ambulatory Visit: Payer: Self-pay

## 2020-06-19 ENCOUNTER — Other Ambulatory Visit (INDEPENDENT_AMBULATORY_CARE_PROVIDER_SITE_OTHER): Payer: 59

## 2020-06-19 DIAGNOSIS — E785 Hyperlipidemia, unspecified: Secondary | ICD-10-CM

## 2020-06-19 DIAGNOSIS — R7989 Other specified abnormal findings of blood chemistry: Secondary | ICD-10-CM

## 2020-06-20 ENCOUNTER — Other Ambulatory Visit: Payer: Self-pay | Admitting: Family Medicine

## 2020-06-20 DIAGNOSIS — R0683 Snoring: Secondary | ICD-10-CM

## 2020-06-20 DIAGNOSIS — R7989 Other specified abnormal findings of blood chemistry: Secondary | ICD-10-CM

## 2020-06-20 DIAGNOSIS — E785 Hyperlipidemia, unspecified: Secondary | ICD-10-CM

## 2020-06-20 DIAGNOSIS — R5383 Other fatigue: Secondary | ICD-10-CM

## 2020-06-20 LAB — HEPATIC FUNCTION PANEL
AG Ratio: 1.8 (calc) (ref 1.0–2.5)
ALT: 76 U/L — ABNORMAL HIGH (ref 9–46)
AST: 51 U/L — ABNORMAL HIGH (ref 10–40)
Albumin: 4.6 g/dL (ref 3.6–5.1)
Alkaline phosphatase (APISO): 93 U/L (ref 36–130)
Bilirubin, Direct: 0.1 mg/dL (ref 0.0–0.2)
Globulin: 2.5 g/dL (calc) (ref 1.9–3.7)
Indirect Bilirubin: 0.5 mg/dL (calc) (ref 0.2–1.2)
Total Bilirubin: 0.6 mg/dL (ref 0.2–1.2)
Total Protein: 7.1 g/dL (ref 6.1–8.1)

## 2020-06-20 LAB — LIPID PANEL
Cholesterol: 137 mg/dL (ref <200)
HDL: 41 mg/dL (ref >=40)
LDL Cholesterol (Calc): 62 mg/dL (calc)
Non-HDL Cholesterol (Calc): 96 mg/dL (calc) (ref <130)
Total CHOL/HDL Ratio: 3.3 (calc) (ref <5.0)
Triglycerides: 325 mg/dL — ABNORMAL HIGH (ref <150)

## 2020-06-20 MED ORDER — FENOFIBRATE 54 MG PO TABS: 54.0000 mg | ORAL_TABLET | Freq: Every day | ORAL | 3 refills | Status: DC

## 2020-06-27 ENCOUNTER — Ambulatory Visit (HOSPITAL_BASED_OUTPATIENT_CLINIC_OR_DEPARTMENT_OTHER)
Admission: RE | Admit: 2020-06-27 | Discharge: 2020-06-27 | Disposition: A | Discharge status: Home / Self Care | Payer: 59 | Source: Ambulatory Visit | Attending: Family Medicine | Admitting: Family Medicine

## 2020-06-27 ENCOUNTER — Other Ambulatory Visit: Payer: Self-pay

## 2020-06-27 ENCOUNTER — Encounter: Payer: Self-pay | Admitting: Family Medicine

## 2020-06-27 DIAGNOSIS — R7989 Other specified abnormal findings of blood chemistry: Secondary | ICD-10-CM

## 2020-06-27 DIAGNOSIS — K760 Fatty (change of) liver, not elsewhere classified: Secondary | ICD-10-CM | POA: Insufficient documentation

## 2020-07-15 ENCOUNTER — Other Ambulatory Visit: Payer: Self-pay | Admitting: Family Medicine

## 2020-07-24 ENCOUNTER — Ambulatory Visit (INDEPENDENT_AMBULATORY_CARE_PROVIDER_SITE_OTHER): Payer: 59 | Admitting: Pulmonary Disease

## 2020-07-24 ENCOUNTER — Encounter: Payer: Self-pay | Admitting: Pulmonary Disease

## 2020-07-24 ENCOUNTER — Other Ambulatory Visit: Payer: Self-pay

## 2020-07-24 VITALS — BP 130/80 | HR 106 | Temp 97.3°F | Ht 76.0 in | Wt 241.2 lb

## 2020-07-24 DIAGNOSIS — R0683 Snoring: Secondary | ICD-10-CM | POA: Diagnosis not present

## 2020-07-24 NOTE — Patient Instructions (Signed)
High probability of significant obstructive sleep apnea  We will schedule you for home sleep study We will with the patient care coordinators know to give you a call if we can get it done early enough  We will update you as soon as results are reviewed  Treatment options as discussed  Follow-up about a month following initiation of treatment Sleep Apnea Sleep apnea affects breathing during sleep. It causes breathing to stop for a short time or to become shallow. It can also increase the risk of:  Heart attack.  Stroke.  Being very overweight (obese).  Diabetes.  Heart failure.  Irregular heartbeat. The goal of treatment is to help you breathe normally again. What are the causes? There are three kinds of sleep apnea:  Obstructive sleep apnea. This is caused by a blocked or collapsed airway.  Central sleep apnea. This happens when the brain does not send the right signals to the muscles that control breathing.  Mixed sleep apnea. This is a combination of obstructive and central sleep apnea. The most common cause of this condition is a collapsed or blocked airway. This can happen if:  Your throat muscles are too relaxed.  Your tongue and tonsils are too large.  You are overweight.  Your airway is too small. What increases the risk?  Being overweight.  Smoking.  Having a small airway.  Being older.  Being male.  Drinking alcohol.  Taking medicines to calm yourself (sedatives or tranquilizers).  Having family members with the condition. What are the signs or symptoms?  Trouble staying asleep.  Being sleepy or tired during the day.  Getting angry a lot.  Loud snoring.  Headaches in the morning.  Not being able to focus your mind (concentrate).  Forgetting things.  Less interest in sex.  Mood swings.  Personality changes.  Feelings of sadness (depression).  Waking up a lot during the night to pee (urinate).  Dry mouth.  Sore throat. How  is this diagnosed?  Your medical history.  A physical exam.  A test that is done when you are sleeping (sleep study). The test is most often done in a sleep lab but may also be done at home. How is this treated?   Sleeping on your side.  Using a medicine to get rid of mucus in your nose (decongestant).  Avoiding the use of alcohol, medicines to help you relax, or certain pain medicines (narcotics).  Losing weight, if needed.  Changing your diet.  Not smoking.  Using a machine to open your airway while you sleep, such as: ? An oral appliance. This is a mouthpiece that shifts your lower jaw forward. ? A CPAP device. This device blows air through a mask when you breathe out (exhale). ? An EPAP device. This has valves that you put in each nostril. ? A BPAP device. This device blows air through a mask when you breathe in (inhale) and breathe out.  Having surgery if other treatments do not work. It is important to get treatment for sleep apnea. Without treatment, it can lead to:  High blood pressure.  Coronary artery disease.  In men, not being able to have an erection (impotence).  Reduced thinking ability. Follow these instructions at home: Lifestyle  Make changes that your doctor recommends.  Eat a healthy diet.  Lose weight if needed.  Avoid alcohol, medicines to help you relax, and some pain medicines.  Do not use any products that contain nicotine or tobacco, such as cigarettes, e-cigarettes,  and chewing tobacco. If you need help quitting, ask your doctor. General instructions  Take over-the-counter and prescription medicines only as told by your doctor.  If you were given a machine to use while you sleep, use it only as told by your doctor.  If you are having surgery, make sure to tell your doctor you have sleep apnea. You may need to bring your device with you.  Keep all follow-up visits as told by your doctor. This is important. Contact a doctor  if:  The machine that you were given to use during sleep bothers you or does not seem to be working.  You do not get better.  You get worse. Get help right away if:  Your chest hurts.  You have trouble breathing in enough air.  You have an uncomfortable feeling in your back, arms, or stomach.  You have trouble talking.  One side of your body feels weak.  A part of your face is hanging down. These symptoms may be an emergency. Do not wait to see if the symptoms will go away. Get medical help right away. Call your local emergency services (911 in the [place] Do not drive yourself to the hospital. Summary  This condition affects breathing during sleep.  The most common cause is a collapsed or blocked airway.  The goal of treatment is to help you breathe normally while you sleep. This information is not intended to replace advice given to you by your health care provider. Make sure you discuss any questions you have with your health care provider. Document Revised: 05/21/2018 Document Reviewed: 03/30/2018 [employer] Patient Education  [place].

## 2020-07-24 NOTE — Progress Notes (Signed)
Kenneth Austin    [id number]    [birth year]  Primary Care Physician:Wendling, Crosby Oyster, DO  Referring Physician: Shelda Pal, [address] [place],  [place] [address]  Chief complaint:   Snoring, witnessed apneas  HPI: Accompanied by spouse Snoring, witnessed apneas, gasping respirations  Dryness of the mouth, sore throat Choking respirations Usually goes to bed at 11, wake up about 7 50 minutes to fall asleep 1-2 awakenings About 15 pound weight gain  Has always snored  Does have mild daytime sleepiness  No family history of obstructive sleep apnea Never smoker History of hypertension, hypercholesterolemia No excessive sweating Memory is good No issues with focus  Outpatient Encounter Medications as of 07/24/2020  Medication Sig  . amLODipine-valsartan (EXFORGE) 5-320 MG tablet TAKE 1 TABLET BY MOUTH EVERY DAY.MAX ON INSURANCE  . fenofibrate 54 MG tablet Take 1 tablet (54 mg total) by mouth daily.  . metoprolol tartrate (LOPRESSOR) 100 MG tablet TAKE 2 HOURS PRIOR TO CT SCAN  . Multiple Vitamin (MULTIVITAMIN) tablet Take 1 tablet by mouth daily.  . rosuvastatin (CRESTOR) 20 MG tablet TAKE 1 TABLET BY MOUTH EVERY DAY  . sildenafil (REVATIO) 20 MG tablet Take 20- 100 mg once daily as needed for ED  . cyclobenzaprine (FLEXERIL) 10 MG tablet Take 0.5-1 tablets (5-10 mg total) by mouth 3 (three) times daily as needed for muscle spasms. (Patient not taking: Reported on 07/24/2020)  . hydrochlorothiazide (MICROZIDE) 12.5 MG capsule Take 1 capsule (12.5 mg total) by mouth daily.   No facility-administered encounter medications on file as of 07/24/2020.    Allergies as of 07/24/2020 - Review Complete 07/24/2020  Allergen Reaction Noted  . Augmentin [amoxicillin-pot clavulanate] Other (See Comments) 01/16/2015    Past Medical History:  Diagnosis Date  . GERD (gastroesophageal reflux disease)   . Hypertension   .  Hypertriglyceridemia   . NAFLD (nonalcoholic fatty liver disease)     Past Surgical History:  Procedure Laterality Date  . TOOTH EXTRACTION    . WISDOM TOOTH EXTRACTION      Family History  Problem Relation Age of Onset  . Cancer Mother        lyposarcoma  . Hypertension Paternal Grandfather   . Stroke Paternal Grandfather   . Heart attack Maternal Grandmother     Social History   Socioeconomic History  . Marital status: Married    Spouse name: Not on file  . Number of children: 2  . Years of education: Not on file  . Highest education level: Not on file  Occupational History  . Not on file  Tobacco Use  . Smoking status: Never Smoker  . Smokeless tobacco: Never Used  Substance and Sexual Activity  . Alcohol use: Yes    Comment: socially  . Drug use: No  . Sexual activity: Yes  Other Topics Concern  . Not on file  Social History Narrative  . Not on file   Social Determinants of Health   Financial Resource Strain:   . Difficulty of Paying Living Expenses: Not on file  Food Insecurity:   . Worried About Charity fundraiser in the Last Year: Not on file  . Ran Out of Food in the Last Year: Not on file  Transportation Needs:   . Lack of Transportation (Medical): Not on file  . Lack of Transportation (Non-Medical): Not on file  Physical Activity:   . Days of Exercise  per Week: Not on file  . Minutes of Exercise per Session: Not on file  Stress:   . Feeling of Stress : Not on file  Social Connections:   . Frequency of Communication with Friends and Family: Not on file  . Frequency of Social Gatherings with Friends and Family: Not on file  . Attends Religious Services: Not on file  . Active Member of Clubs or Organizations: Not on file  . Attends Archivist Meetings: Not on file  . Marital Status: Not on file  Intimate Partner Violence:   . Fear of Current or Ex-Partner: Not on file  . Emotionally Abused: Not on file  . Physically Abused: Not on  file  . Sexually Abused: Not on file    Review of Systems  Respiratory: Positive for shortness of breath.   Psychiatric/Behavioral: Positive for sleep disturbance.    Vitals:   07/24/20 1615  BP: 130/80  Pulse: (!) 106  Temp: (!) 97.3 F (36.3 C)  SpO2: 98%     Physical Exam Constitutional:      Appearance: He is obese.  HENT:     Head: Normocephalic and atraumatic.     Mouth/Throat:     Mouth: Mucous membranes are moist.     Comments: Mallampati 3, crowded oropharynx Eyes:     General:        Right eye: No discharge.        Left eye: No discharge.     Pupils: Pupils are equal, round, and reactive to light.  Cardiovascular:     Rate and Rhythm: Normal rate and regular rhythm.     Pulses: Normal pulses.     Heart sounds: No murmur heard.  No friction rub.  Pulmonary:     Effort: Pulmonary effort is normal. No respiratory distress.     Breath sounds: Normal breath sounds. No stridor. No wheezing or rhonchi.  Musculoskeletal:     Cervical back: No rigidity or tenderness.  Neurological:     Mental Status: He is alert.  Psychiatric:        Mood and Affect: Mood normal.   No flowsheet data found.  ESS of 6  Assessment:  High probability of significant obstructive sleep apnea  Daytime sleepiness  Nonrestorative sleep  Pathophysiology of sleep disordered breathing discussed with the patient Treatment options for sleep disordered breathing discussed with the patient  Plan/Recommendations: Schedule patient for home sleep study  Risks of not treating sleep disordered breathing discussed with the patient  Follow-up tentatively about a month following initiation of treatment  Weight loss efforts   Sherrilyn Rist MD [facility] 07/24/2020, 4:34 PM  CC: Shelda Pal*

## 2020-08-01 ENCOUNTER — Other Ambulatory Visit: Payer: Self-pay

## 2020-08-01 ENCOUNTER — Other Ambulatory Visit (INDEPENDENT_AMBULATORY_CARE_PROVIDER_SITE_OTHER): Payer: 59

## 2020-08-01 DIAGNOSIS — E785 Hyperlipidemia, unspecified: Secondary | ICD-10-CM | POA: Diagnosis not present

## 2020-08-02 LAB — LIPID PANEL
Cholesterol: 145 mg/dL (ref <200)
HDL: 44 mg/dL (ref >=40)
LDL Cholesterol (Calc): 67 mg/dL (calc)
Non-HDL Cholesterol (Calc): 101 mg/dL (calc) (ref <130)
Total CHOL/HDL Ratio: 3.3 (calc) (ref <5.0)
Triglycerides: 256 mg/dL — ABNORMAL HIGH (ref <150)

## 2020-08-03 MED ORDER — FENOFIBRATE 54 MG PO TABS
108.0000 mg | ORAL_TABLET | Freq: Every day | ORAL | 3 refills | Status: DC
Start: 2020-08-03 — End: 2020-11-07

## 2020-09-10 ENCOUNTER — Ambulatory Visit: Payer: 59

## 2020-09-10 ENCOUNTER — Other Ambulatory Visit: Payer: Self-pay

## 2020-09-10 DIAGNOSIS — G4733 Obstructive sleep apnea (adult) (pediatric): Secondary | ICD-10-CM | POA: Diagnosis not present

## 2020-09-10 DIAGNOSIS — R0683 Snoring: Secondary | ICD-10-CM

## 2020-09-12 MED FILL — SILDENAFIL CITRATE 20 MG TA: 20 | 30 days supply | Qty: 30 | Fill #1

## 2020-09-19 ENCOUNTER — Telehealth: Payer: Self-pay | Admitting: Pulmonary Disease

## 2020-09-19 DIAGNOSIS — G4733 Obstructive sleep apnea (adult) (pediatric): Secondary | ICD-10-CM | POA: Diagnosis not present

## 2020-09-19 NOTE — Telephone Encounter (Addendum)
  This note was created in error  Sherrilyn Rist, MD [facility] Pager: [phone]      Call patient  Sleep study result  Date of study: 09/10/2020  Impression: Mild obstructive sleep apnea Mild oxygen desaturations  Recommendation: Options of treatment for mild obstructive sleep apnea will include  1.  CPAP therapy 2.  An oral device 3.  Watchful waiting with aggressive weight loss measures  With significant daytime symptoms and nonrestorative sleep, my recommendation will be for CPAP therapy  Recommend Auto-CPAP, settings of 5-15 will be appropriate   Follow-up following initiation of treatment

## 2020-09-20 ENCOUNTER — Telehealth: Payer: Self-pay | Admitting: Pulmonary Disease

## 2020-09-20 NOTE — Telephone Encounter (Signed)
Created an addendum to sleep study result which was created in error

## 2020-09-20 NOTE — Telephone Encounter (Signed)
Call patient  Sleep study result  Date of study: 09/10/2020  Impression: Mild obstructive sleep apnea Mild oxygen desaturations  Recommendation: Options of treatment for mild obstructive sleep apnea will include  1.  CPAP therapy 2.  An oral device 3.  Watchful waiting with aggressive weight loss measures  With significant daytime symptoms and nonrestorative sleep, my recommendation will be for CPAP therapy  Recommend Auto-CPAP, settings of 5-15 will be appropriate   Follow-up following initiation of treatment

## 2020-09-20 NOTE — Telephone Encounter (Signed)
My chart message sent to patient if regards to results as well. Please see that encounter. Will close this one.

## 2020-10-18 NOTE — Progress Notes (Signed)
HPI: FU CP. Nuclear study October 2021 showed ejection fraction 52%, apical thinning but no ischemia. Echocardiogram September 2021 showed normal LV function, grade 1 diastolic dysfunction. Coronary CTA was canceled in favor of above tests due to persistent tachycardia despite beta-blockade. Since last seen there is no exertional chest pain, dyspnea, palpitations or syncope.  He did have 2 episodes when his heart rate was elevated.  He recorded strips on his apple watch and I have reviewed and this showed sinus tachycardia.  Current Outpatient Medications  Medication Sig Dispense Refill  . amLODipine-valsartan (EXFORGE) 5-320 MG tablet TAKE 1 TABLET BY MOUTH EVERY DAY.MAX ON INSURANCE 90 tablet 2  . fenofibrate 54 MG tablet Take 2 tablets (108 mg total) by mouth daily. 60 tablet 3  . Multiple Vitamin (MULTIVITAMIN) tablet Take 1 tablet by mouth daily.    . rosuvastatin (CRESTOR) 20 MG tablet TAKE 1 TABLET BY MOUTH EVERY DAY 90 tablet 1  . sildenafil (REVATIO) 20 MG tablet Take 20- 100 mg once daily as needed for ED 30 tablet 3  . hydrochlorothiazide (MICROZIDE) 12.5 MG capsule Take 1 capsule (12.5 mg total) by mouth daily. 90 capsule 3   No current facility-administered medications for this visit.     Past Medical History:  Diagnosis Date  . GERD (gastroesophageal reflux disease)   . Hypertension   . Hypertriglyceridemia   . NAFLD (nonalcoholic fatty liver disease)     Past Surgical History:  Procedure Laterality Date  . TOOTH EXTRACTION    . WISDOM TOOTH EXTRACTION      Social History   Socioeconomic History  . Marital status: Married    Spouse name: Not on file  . Number of children: 2  . Years of education: Not on file  . Highest education level: Not on file  Occupational History  . Not on file  Tobacco Use  . Smoking status: Never Smoker  . Smokeless tobacco: Never Used  Substance and Sexual Activity  . Alcohol use: Yes    Comment: socially  . Drug use: No   . Sexual activity: Yes  Other Topics Concern  . Not on file  Social History Narrative  . Not on file   Social Determinants of Health   Financial Resource Strain: Not on file  Food Insecurity: Not on file  Transportation Needs: Not on file  Physical Activity: Not on file  Stress: Not on file  Social Connections: Not on file  Intimate Partner Violence: Not on file    Family History  Problem Relation Age of Onset  . Cancer Mother        lyposarcoma  . Hypertension Paternal Grandfather   . Stroke Paternal Grandfather   . Heart attack Maternal Grandmother     ROS: no fevers or chills, productive cough, hemoptysis, dysphasia, odynophagia, melena, hematochezia, dysuria, hematuria, rash, seizure activity, orthopnea, PND, pedal edema, claudication. Remaining systems are negative.  Physical Exam: Well-developed well-nourished in no acute distress.  Skin is warm and dry.  HEENT is normal.  Neck is supple.  Chest is clear to auscultation with normal expansion.  Cardiovascular exam is regular rate and rhythm.  Abdominal exam nontender or distended. No masses palpated. Extremities show no edema. neuro grossly intact  ECG-normal sinus rhythm at a rate of 93, no ST changes.  Personally reviewed  A/P  1 chest pain/dyspnea-LV function is normal on echocardiogram and nuclear study shows no ischemia.  No recurrent exertional chest pain.  No plans for further  evaluation at this point.  2 history of palpitations-had recent elevation in heart rate and he recorded strips on his apple watch.  I reviewed and this showed sinus tachycardia.  He is otherwise asymptomatic.  Will follow.  3 hypertension-patient's blood pressure is controlled. Continue present medications and follow.  4 hyperlipidemia-continue present medications.  Lipids and liver monitored by primary care.  Kirk Ruths, MD

## 2020-10-25 DIAGNOSIS — D2261 Melanocytic nevi of right upper limb, including shoulder: Secondary | ICD-10-CM | POA: Diagnosis not present

## 2020-10-25 DIAGNOSIS — L570 Actinic keratosis: Secondary | ICD-10-CM | POA: Diagnosis not present

## 2020-10-25 DIAGNOSIS — D2262 Melanocytic nevi of left upper limb, including shoulder: Secondary | ICD-10-CM | POA: Diagnosis not present

## 2020-10-25 DIAGNOSIS — D225 Melanocytic nevi of trunk: Secondary | ICD-10-CM | POA: Diagnosis not present

## 2020-10-25 DIAGNOSIS — D2272 Melanocytic nevi of left lower limb, including hip: Secondary | ICD-10-CM | POA: Diagnosis not present

## 2020-10-25 DIAGNOSIS — L820 Inflamed seborrheic keratosis: Secondary | ICD-10-CM | POA: Diagnosis not present

## 2020-10-31 ENCOUNTER — Encounter: Payer: Self-pay | Admitting: Cardiology

## 2020-10-31 ENCOUNTER — Other Ambulatory Visit: Payer: Self-pay

## 2020-10-31 ENCOUNTER — Ambulatory Visit: Payer: 59 | Admitting: Cardiology

## 2020-10-31 VITALS — BP 132/84 | HR 93 | Ht 76.0 in | Wt 244.4 lb

## 2020-10-31 DIAGNOSIS — R072 Precordial pain: Secondary | ICD-10-CM

## 2020-10-31 DIAGNOSIS — E7800 Pure hypercholesterolemia, unspecified: Secondary | ICD-10-CM | POA: Diagnosis not present

## 2020-10-31 DIAGNOSIS — I10 Essential (primary) hypertension: Secondary | ICD-10-CM | POA: Diagnosis not present

## 2020-10-31 NOTE — Patient Instructions (Signed)

## 2020-11-07 ENCOUNTER — Other Ambulatory Visit: Payer: Self-pay | Admitting: Family Medicine

## 2020-12-26 ENCOUNTER — Other Ambulatory Visit: Payer: Self-pay | Admitting: Family Medicine

## 2021-01-16 ENCOUNTER — Other Ambulatory Visit: Payer: Self-pay | Admitting: Family Medicine

## 2021-01-16 DIAGNOSIS — I10 Essential (primary) hypertension: Secondary | ICD-10-CM

## 2021-03-23 ENCOUNTER — Other Ambulatory Visit: Payer: Self-pay | Admitting: Family Medicine

## 2021-03-27 ENCOUNTER — Other Ambulatory Visit: Payer: Self-pay | Admitting: Cardiology

## 2021-03-27 DIAGNOSIS — I10 Essential (primary) hypertension: Secondary | ICD-10-CM

## 2021-04-01 ENCOUNTER — Other Ambulatory Visit (HOSPITAL_BASED_OUTPATIENT_CLINIC_OR_DEPARTMENT_OTHER): Payer: Self-pay

## 2021-04-01 MED FILL — Sildenafil Citrate Tab 20 MG: ORAL | 6 days supply | Qty: 30 | Fill #0 | Status: AC

## 2021-04-29 ENCOUNTER — Other Ambulatory Visit: Payer: Self-pay | Admitting: Family Medicine

## 2021-07-26 ENCOUNTER — Other Ambulatory Visit: Payer: Self-pay | Admitting: Family Medicine

## 2021-08-05 ENCOUNTER — Other Ambulatory Visit (HOSPITAL_BASED_OUTPATIENT_CLINIC_OR_DEPARTMENT_OTHER): Payer: Self-pay

## 2021-08-05 ENCOUNTER — Other Ambulatory Visit: Payer: Self-pay | Admitting: Family Medicine

## 2021-08-05 DIAGNOSIS — F5221 Male erectile disorder: Secondary | ICD-10-CM

## 2021-08-05 MED ORDER — SILDENAFIL CITRATE 20 MG PO TABS
ORAL_TABLET | ORAL | 0 refills | Status: DC
  Filled 2021-08-05: qty 30, 6d supply, fill #0

## 2021-09-18 ENCOUNTER — Other Ambulatory Visit: Payer: Self-pay | Admitting: Family Medicine

## 2021-09-18 DIAGNOSIS — I10 Essential (primary) hypertension: Secondary | ICD-10-CM

## 2021-10-20 ENCOUNTER — Other Ambulatory Visit: Payer: Self-pay | Admitting: Family Medicine

## 2021-10-29 ENCOUNTER — Encounter: Payer: Self-pay | Admitting: Family Medicine

## 2021-10-29 ENCOUNTER — Other Ambulatory Visit: Payer: Self-pay | Admitting: Family Medicine

## 2021-10-29 ENCOUNTER — Other Ambulatory Visit (HOSPITAL_BASED_OUTPATIENT_CLINIC_OR_DEPARTMENT_OTHER): Payer: Self-pay

## 2021-10-29 ENCOUNTER — Ambulatory Visit (INDEPENDENT_AMBULATORY_CARE_PROVIDER_SITE_OTHER): Payer: BC Managed Care – PPO | Admitting: Family Medicine

## 2021-10-29 VITALS — BP 120/78 | HR 94 | Temp 98.7°F | Ht 76.0 in | Wt 242.1 lb

## 2021-10-29 DIAGNOSIS — Z0000 Encounter for general adult medical examination without abnormal findings: Secondary | ICD-10-CM | POA: Diagnosis not present

## 2021-10-29 DIAGNOSIS — Z1211 Encounter for screening for malignant neoplasm of colon: Secondary | ICD-10-CM

## 2021-10-29 DIAGNOSIS — Z23 Encounter for immunization: Secondary | ICD-10-CM | POA: Diagnosis not present

## 2021-10-29 DIAGNOSIS — F5221 Male erectile disorder: Secondary | ICD-10-CM

## 2021-10-29 DIAGNOSIS — H9201 Otalgia, right ear: Secondary | ICD-10-CM | POA: Diagnosis not present

## 2021-10-29 DIAGNOSIS — R5383 Other fatigue: Secondary | ICD-10-CM

## 2021-10-29 DIAGNOSIS — E785 Hyperlipidemia, unspecified: Secondary | ICD-10-CM

## 2021-10-29 DIAGNOSIS — Z125 Encounter for screening for malignant neoplasm of prostate: Secondary | ICD-10-CM

## 2021-10-29 LAB — CBC
HCT: 43.4 % (ref 39.0–52.0)
Hemoglobin: 14.4 g/dL (ref 13.0–17.0)
MCHC: 33.3 g/dL (ref 30.0–36.0)
MCV: 92 fl (ref 78.0–100.0)
Platelets: 274 10*3/uL (ref 150.0–400.0)
RBC: 4.72 Mil/uL (ref 4.22–5.81)
RDW: 12.5 % (ref 11.5–15.5)
WBC: 4.4 10*3/uL (ref 4.0–10.5)

## 2021-10-29 LAB — COMPREHENSIVE METABOLIC PANEL
ALT: 35 U/L (ref 0–53)
AST: 28 U/L (ref 0–37)
Albumin: 4.8 g/dL (ref 3.5–5.2)
Alkaline Phosphatase: 75 U/L (ref 39–117)
BUN: 17 mg/dL (ref 6–23)
CO2: 29 mEq/L (ref 19–32)
Calcium: 9.9 mg/dL (ref 8.4–10.5)
Chloride: 101 mEq/L (ref 96–112)
Creatinine, Ser: 1.19 mg/dL (ref 0.40–1.50)
GFR: 72.17 mL/min (ref >60.00)
Glucose, Bld: 113 mg/dL — ABNORMAL HIGH (ref 70–99)
Potassium: 4.2 mEq/L (ref 3.5–5.1)
Sodium: 139 mEq/L (ref 135–145)
Total Bilirubin: 0.5 mg/dL (ref 0.2–1.2)
Total Protein: 7.1 g/dL (ref 6.0–8.3)

## 2021-10-29 LAB — LIPID PANEL
Cholesterol: 134 mg/dL (ref 0–200)
HDL: 38.8 mg/dL — ABNORMAL LOW (ref >39.00)
NonHDL: 95.33
Total CHOL/HDL Ratio: 3
Triglycerides: 233 mg/dL — ABNORMAL HIGH (ref 0.0–149.0)
VLDL: 46.6 mg/dL — ABNORMAL HIGH (ref 0.0–40.0)

## 2021-10-29 LAB — TESTOSTERONE: Testosterone: 317.31 ng/dL (ref 300.00–890.00)

## 2021-10-29 LAB — PSA: PSA: 0.52 ng/mL (ref 0.10–4.00)

## 2021-10-29 LAB — LDL CHOLESTEROL, DIRECT: Direct LDL: 73 mg/dL

## 2021-10-29 MED ORDER — FLUTICASONE PROPIONATE 50 MCG/ACT NA SUSP
2.0000 | Freq: Every day | NASAL | 6 refills | Status: DC
  Filled 2021-10-29: qty 16, 30d supply, fill #0

## 2021-10-29 MED ORDER — SILDENAFIL CITRATE 20 MG PO TABS
ORAL_TABLET | ORAL | 5 refills | Status: DC
  Filled 2021-10-29: qty 30, 10d supply, fill #0
  Filled 2022-06-24: qty 30, 10d supply, fill #1

## 2021-10-29 NOTE — Progress Notes (Signed)
Chief Complaint  ?Patient presents with  ? Annual Exam  ?  Right ear pain for 6 months ?  ? ? ?Well Male ?Kenneth Austin is here for a complete physical.   ?His last physical was >1 year ago.  ?Current diet: in general, a "healthy" diet.   ?Current exercise: Cycling, HIIT, walking ?Weight trend: stable ?Fatigue out of ordinary? Yes.  ?Seat belt? Yes.   ?Advanced directive? Yes ? ?Health maintenance ?Tetanus- Due ?CCS- Due ?HIV- Yes ?Hep C- Yes ? ?R ear pain ?2 years of intermittent R inner ear pain. No fevers, drainage, hearing loss out of ordinary, jaw pain, injury, URI s/s's. Bothers him most nights, worse when he lies on R side. He cannot put ear buds in R ear due to pain from noise.  ? ?Past Medical History:  ?Diagnosis Date  ? GERD (gastroesophageal reflux disease)   ? Hypertension   ? Hypertriglyceridemia   ? NAFLD (nonalcoholic fatty liver disease)   ?  ? ?Past Surgical History:  ?Procedure Laterality Date  ? TOOTH EXTRACTION    ? WISDOM TOOTH EXTRACTION    ? ? ?Medications  ?Current Outpatient Medications on File Prior to Visit  ?Medication Sig Dispense Refill  ? amLODipine-valsartan (EXFORGE) 5-320 MG tablet TAKE 1 TABLET BY MOUTH EVERY DAY.MAX ON INSURANCE 90 tablet 2  ? fenofibrate 54 MG tablet TAKE 2 TABLETS (108 MG TOTAL) BY MOUTH DAILY. 180 tablet 0  ? hydrochlorothiazide (MICROZIDE) 12.5 MG capsule TAKE 1 CAPSULE BY MOUTH EVERY DAY 90 capsule 3  ? Multiple Vitamin (MULTIVITAMIN) tablet Take 1 tablet by mouth daily.    ? rosuvastatin (CRESTOR) 20 MG tablet TAKE 1 TABLET BY MOUTH EVERY DAY 90 tablet 1  ? sildenafil (REVATIO) 20 MG tablet TAKE 1 TO 5 TABLETS (20-100 MG) BY MOUTH ONCE DAILY AS NEEDED FOR ERECTILE DYSFUNCTION. **NEED APPOINTMENT FOR FURTHER REFILLS* 30 tablet 0  ? ? ?Allergies ?Allergies  ?Allergen Reactions  ? Augmentin [Amoxicillin-Pot Clavulanate] Other (See Comments)  ?  Abdominal pain and loose stools.   ? ? ?Family History ?Family History  ?Problem Relation Age of Onset  ? Cancer  Mother   ?     lyposarcoma  ? Hypertension Paternal Grandfather   ? Stroke Paternal Grandfather   ? Heart attack Maternal Grandmother   ? ? ?Review of Systems: ?Constitutional: no fevers or chills ?Eye:  no recent significant change in vision ?Ear/Nose/Mouth/Throat:  Ears:  +R ear pain ?Nose/Mouth/Throat:  no complaints of nasal congestion, no sore throat ?Cardiovascular:  no chest pain ?Respiratory:  no shortness of breath ?Gastrointestinal:  no abdominal pain, no change in bowel habits ?GU:  Male: negative for dysuria, frequency, and incontinence ?Musculoskeletal/Extremities:  no pain of the joints ?Integumentary (Skin/Breast):  no abnormal skin lesions reported ?Neurologic:  no headaches ?Endocrine: No unexpected weight changes ?Hematologic/Lymphatic:  no night sweats ? ?Exam ?BP 120/78   Pulse 94   Temp 98.7 ?F (37.1 ?C) (Oral)   Ht '6\' 4"'$  (1.93 m)   Wt 242 lb 2 oz (109.8 kg)   SpO2 97%   BMI 29.47 kg/m?  ?General:  well developed, well nourished, in no apparent distress ?Skin:  no significant moles, warts, or growths ?Head:  no masses, lesions, or tenderness ?Eyes:  pupils equal and round, sclera anicteric without injection ?Ears:  canals without lesions, TMs shiny without retraction, no obvious effusion, no erythema ?Nose:  nares patent, septum midline, mucosa normal ?Throat/Pharynx:  lips and gingiva without lesion; tongue and uvula  midline; non-inflamed pharynx; no exudates or postnasal drainage ?Neck: neck supple without adenopathy, thyromegaly, or masses ?Lungs:  clear to auscultation, breath sounds equal bilaterally, no respiratory distress ?Cardio:  regular rate and rhythm, no bruits, no LE edema ?Abdomen:  abdomen soft, nontender; bowel sounds normal; no masses or organomegaly ?Rectal: Deferred ?Musculoskeletal:  symmetrical muscle groups noted without atrophy or deformity ?Extremities:  no clubbing, cyanosis, or edema, no deformities, no skin discoloration ?Neuro:  gait normal; deep tendon  reflexes normal and symmetric ?Psych: well oriented with normal range of affect and appropriate judgment/insight ? ?Assessment and Plan ? ?Well adult exam - Plan: CBC, Comprehensive metabolic panel, Lipid panel ? ?Erectile disorder, acquired, generalized, moderate - Plan: sildenafil (REVATIO) 20 MG tablet ? ?Screen for colon cancer - Plan: Ambulatory referral to Gastroenterology ? ?Fatigue, unspecified type - Plan: Testosterone ? ?Screening for prostate cancer - Plan: PSA ? ?Referred otalgia of right ear  ? ?Well 51 y.o. male. ?Counseled on diet and exercise. ?Counseled on risks and benefits of prostate cancer screening with PSA. The patient agrees to undergo screening.  ?Ear pain: INCS for 8 weeks, can refer to ENT if no improvement.  ?Requested copy of advanced directive.  ?Tdap today.  ?CCS- refer GI today.  ?Other orders as above. ?Follow up in 6 mo pending the above workup. ?The patient voiced understanding and agreement to the plan. ? ?Shelda Pal, DO ?10/29/21 ?8:44 AM ? ?

## 2021-10-29 NOTE — Patient Instructions (Addendum)
Give [place] 2-3 business days to get the results of your labs back.  ? ?Keep the diet clean and stay active. ? ?Please get me a copy of your advanced directive form at your convenience.  ? ?If you do not hear anything about your referral in the next 1-2 weeks, call our office and ask for an update. ? ?Try Flonase for 8 weeks. Send me a message after mid April if you wish to see an ENT specialist.  ? ?Let us know if you need anything. ?

## 2021-11-04 DIAGNOSIS — L821 Other seborrheic keratosis: Secondary | ICD-10-CM | POA: Diagnosis not present

## 2021-11-04 DIAGNOSIS — L918 Other hypertrophic disorders of the skin: Secondary | ICD-10-CM | POA: Diagnosis not present

## 2021-11-04 DIAGNOSIS — D2262 Melanocytic nevi of left upper limb, including shoulder: Secondary | ICD-10-CM | POA: Diagnosis not present

## 2021-11-04 DIAGNOSIS — D225 Melanocytic nevi of trunk: Secondary | ICD-10-CM | POA: Diagnosis not present

## 2021-11-04 DIAGNOSIS — D2261 Melanocytic nevi of right upper limb, including shoulder: Secondary | ICD-10-CM | POA: Diagnosis not present

## 2021-11-12 ENCOUNTER — Other Ambulatory Visit: Payer: Self-pay | Admitting: Family Medicine

## 2021-11-12 ENCOUNTER — Other Ambulatory Visit (HOSPITAL_BASED_OUTPATIENT_CLINIC_OR_DEPARTMENT_OTHER): Payer: Self-pay

## 2021-11-12 ENCOUNTER — Other Ambulatory Visit (INDEPENDENT_AMBULATORY_CARE_PROVIDER_SITE_OTHER): Payer: BC Managed Care – PPO

## 2021-11-12 DIAGNOSIS — E785 Hyperlipidemia, unspecified: Secondary | ICD-10-CM | POA: Diagnosis not present

## 2021-11-12 LAB — BASIC METABOLIC PANEL
BUN: 18 mg/dL (ref 6–23)
CO2: 27 mEq/L (ref 19–32)
Calcium: 9.6 mg/dL (ref 8.4–10.5)
Chloride: 102 mEq/L (ref 96–112)
Creatinine, Ser: 1.3 mg/dL (ref 0.40–1.50)
GFR: 64.89 mL/min (ref >60.00)
Glucose, Bld: 109 mg/dL — ABNORMAL HIGH (ref 70–99)
Potassium: 4.2 mEq/L (ref 3.5–5.1)
Sodium: 138 mEq/L (ref 135–145)

## 2021-11-12 LAB — LIPID PANEL
Cholesterol: 127 mg/dL (ref 0–200)
HDL: 43.3 mg/dL (ref >39.00)
NonHDL: 83.75
Total CHOL/HDL Ratio: 3
Triglycerides: 234 mg/dL — ABNORMAL HIGH (ref 0.0–149.0)
VLDL: 46.8 mg/dL — ABNORMAL HIGH (ref 0.0–40.0)

## 2021-11-12 LAB — LDL CHOLESTEROL, DIRECT: Direct LDL: 64 mg/dL

## 2021-11-12 MED ORDER — FENOFIBRATE 160 MG PO TABS
160.0000 mg | ORAL_TABLET | Freq: Every day | ORAL | 3 refills | Status: DC
  Filled 2021-11-12: qty 30, 30d supply, fill #0

## 2021-12-12 ENCOUNTER — Encounter: Payer: Self-pay | Admitting: Family Medicine

## 2021-12-24 ENCOUNTER — Other Ambulatory Visit (INDEPENDENT_AMBULATORY_CARE_PROVIDER_SITE_OTHER): Payer: BC Managed Care – PPO

## 2021-12-24 DIAGNOSIS — E785 Hyperlipidemia, unspecified: Secondary | ICD-10-CM | POA: Diagnosis not present

## 2021-12-24 LAB — LIPID PANEL
Cholesterol: 121 mg/dL (ref 0–200)
HDL: 45.6 mg/dL (ref >39.00)
LDL Cholesterol: 49 mg/dL (ref 0–99)
NonHDL: 75.61
Total CHOL/HDL Ratio: 3
Triglycerides: 132 mg/dL (ref 0.0–149.0)
VLDL: 26.4 mg/dL (ref 0.0–40.0)

## 2021-12-24 LAB — HEPATIC FUNCTION PANEL
ALT: 33 U/L (ref 0–53)
AST: 27 U/L (ref 0–37)
Albumin: 4.9 g/dL (ref 3.5–5.2)
Alkaline Phosphatase: 83 U/L (ref 39–117)
Bilirubin, Direct: 0.1 mg/dL (ref 0.0–0.3)
Total Bilirubin: 0.4 mg/dL (ref 0.2–1.2)
Total Protein: 7.6 g/dL (ref 6.0–8.3)

## 2022-01-07 NOTE — Progress Notes (Signed)
HPI:FU CP. Nuclear study October 2021 showed ejection fraction 52%, apical thinning but no ischemia. Echocardiogram September 2021 showed normal LV function, grade 1 diastolic dysfunction. Coronary CTA was canceled in favor of above tests due to persistent tachycardia despite beta-blockade. Since last seen there is no dyspnea, chest pain, palpitations or syncope.  Minimal pedal edema.  He is not exercising routinely and trying to follow a diet.  He has some dizziness with standing at times.  Current Outpatient Medications  Medication Sig Dispense Refill   amLODipine-valsartan (EXFORGE) 5-320 MG tablet TAKE 1 TABLET BY MOUTH EVERY DAY.MAX ON INSURANCE 90 tablet 2   fenofibrate 160 MG tablet Take 1 tablet (160 mg total) by mouth daily. 30 tablet 3   fluticasone (FLONASE) 50 MCG/ACT nasal spray Place 2 sprays into both nostrils daily. 16 g 6   metroNIDAZOLE (METROGEL) 0.75 % gel Apply topically as needed.     Multiple Vitamin (MULTIVITAMIN) tablet Take 1 tablet by mouth daily.     rosuvastatin (CRESTOR) 20 MG tablet TAKE 1 TABLET BY MOUTH EVERY DAY 90 tablet 1   sildenafil (REVATIO) 20 MG tablet Take 3 tablets by mouth daily as needed 30 tablet 5   No current facility-administered medications for this visit.     Past Medical History:  Diagnosis Date   GERD (gastroesophageal reflux disease)    Hypertension    Hypertriglyceridemia    NAFLD (nonalcoholic fatty liver disease)     Past Surgical History:  Procedure Laterality Date   TOOTH EXTRACTION     WISDOM TOOTH EXTRACTION      Social History   Socioeconomic History   Marital status: Married    Spouse name: Not on file   Number of children: 2   Years of education: Not on file   Highest education level: Not on file  Occupational History   Not on file  Tobacco Use   Smoking status: Never   Smokeless tobacco: Never  Substance and Sexual Activity   Alcohol use: Yes    Comment: socially   Drug use: No   Sexual activity:  Yes  Other Topics Concern   Not on file  Social History Narrative   Not on file   Social Determinants of Health   Financial Resource Strain: Not on file  Food Insecurity: Not on file  Transportation Needs: Not on file  Physical Activity: Not on file  Stress: Not on file  Social Connections: Not on file  Intimate Partner Violence: Not on file    Family History  Problem Relation Age of Onset   Cancer Mother        lyposarcoma   Hypertension Paternal Grandfather    Stroke Paternal Grandfather    Heart attack Maternal Grandmother     ROS: no fevers or chills, productive cough, hemoptysis, dysphasia, odynophagia, melena, hematochezia, dysuria, hematuria, rash, seizure activity, orthopnea, PND, claudication. Remaining systems are negative.  Physical Exam: Well-developed well-nourished in no acute distress.  Skin is warm and dry.  HEENT is normal.  Neck is supple.  Chest is clear to auscultation with normal expansion.  Cardiovascular exam is regular rate and rhythm.  Abdominal exam nontender or distended. No masses palpated. Extremities show trace edema. neuro grossly intact  ECG-normal sinus rhythm at a rate of 84, no ST changes.  RV conduction delay.  Personally reviewed  A/P  1 history of chest pain-no recent symptoms.  Previous functional study negative and LV function is normal.  We will continue  to follow.  2 palpitations-controlled.  We will consider beta-blockade in the future if necessary.  3 hypertension-patient's blood pressure is controlled.  He is describing occasional orthostatic symptoms.  He also has minimal pedal edema.  I will discontinue amlodipine and continue valsartan.  Follow blood pressure and adjust medications as needed.  4 hyperlipidemia-continue present medications.  Kirk Ruths, MD

## 2022-01-15 ENCOUNTER — Other Ambulatory Visit: Payer: Self-pay | Admitting: Family Medicine

## 2022-01-15 ENCOUNTER — Encounter: Payer: Self-pay | Admitting: Cardiology

## 2022-01-15 ENCOUNTER — Ambulatory Visit: Payer: BC Managed Care – PPO | Admitting: Cardiology

## 2022-01-15 VITALS — BP 128/84 | HR 84 | Ht 76.0 in | Wt 241.0 lb

## 2022-01-15 DIAGNOSIS — I10 Essential (primary) hypertension: Secondary | ICD-10-CM

## 2022-01-15 DIAGNOSIS — R072 Precordial pain: Secondary | ICD-10-CM

## 2022-01-15 DIAGNOSIS — E7800 Pure hypercholesterolemia, unspecified: Secondary | ICD-10-CM | POA: Diagnosis not present

## 2022-01-15 DIAGNOSIS — R002 Palpitations: Secondary | ICD-10-CM | POA: Diagnosis not present

## 2022-01-15 MED ORDER — VALSARTAN 320 MG PO TABS: 320.0000 mg | ORAL_TABLET | Freq: Every day | ORAL | 3 refills | Status: DC

## 2022-01-15 NOTE — Patient Instructions (Signed)
Medication Instructions:   STOP EXFORGE  START VALSARTAN 320 MG ONCE DAILY  *If you need a refill on your cardiac medications before your next appointment, please call your pharmacy*   Follow-Up: At [facility], you and your health needs are our priority.  As part of our continuing mission to provide you with exceptional heart care, we have created designated Provider Care Teams.  These Care Teams include your primary Cardiologist (physician) and Advanced Practice Providers (APPs -  Physician Assistants and Nurse Practitioners) who all work together to provide you with the care you need, when you need it.  We recommend signing up for the patient portal called "MyChart".  Sign up information is provided on this After Visit Summary.  MyChart is used to connect with patients for Virtual Visits (Telemedicine).  Patients are able to view lab/test results, encounter notes, upcoming appointments, etc.  Non-urgent messages can be sent to your provider as well.   To learn more about what you can do with MyChart, go to [email or web address].    Your next appointment:   12 month(s)  The format for your next appointment:   In Person  Provider:   Kirk Ruths, MD      Important Information About Sugar

## 2022-01-31 ENCOUNTER — Ambulatory Visit (AMBULATORY_SURGERY_CENTER): Payer: BC Managed Care – PPO | Admitting: *Deleted

## 2022-01-31 VITALS — Ht 76.0 in | Wt 241.0 lb

## 2022-01-31 DIAGNOSIS — Z1211 Encounter for screening for malignant neoplasm of colon: Secondary | ICD-10-CM

## 2022-01-31 MED ORDER — NA SULFATE-K SULFATE-MG SULF 17.5-3.13-1.6 GM/177ML PO SOLN: 1.0000 | ORAL | 0 refills | Status: DC

## 2022-01-31 NOTE — Progress Notes (Signed)
Patient's pre-visit was done today over the phone with the patient. Name,DOB and address verified. Patient denies any allergies to Eggs and Soy. Patient denies any problems with anesthesia/sedation. Patient is not taking any diet pills or blood thinners. No home Oxygen. Insurance confirmed with patient.  Prep instructions sent to pt's MyChart or mailed to pt-pt is aware. Patient understands to call us back with any questions or concerns. Patient is aware of our care-partner policy. Pt aware to use good rx.  EMMI education assigned to the patient for the procedure, sent to [place].

## 2022-02-14 ENCOUNTER — Other Ambulatory Visit (HOSPITAL_BASED_OUTPATIENT_CLINIC_OR_DEPARTMENT_OTHER): Payer: Self-pay

## 2022-02-25 ENCOUNTER — Encounter: Payer: Self-pay | Admitting: Internal Medicine

## 2022-02-27 ENCOUNTER — Ambulatory Visit (AMBULATORY_SURGERY_CENTER): Payer: BC Managed Care – PPO | Admitting: Internal Medicine

## 2022-02-27 ENCOUNTER — Encounter: Payer: Self-pay | Admitting: Internal Medicine

## 2022-02-27 VITALS — BP 120/88 | HR 91 | Temp 98.9°F | Resp 15 | Ht 76.0 in | Wt 241.0 lb

## 2022-02-27 DIAGNOSIS — Z1211 Encounter for screening for malignant neoplasm of colon: Secondary | ICD-10-CM

## 2022-02-27 DIAGNOSIS — D122 Benign neoplasm of ascending colon: Secondary | ICD-10-CM

## 2022-02-27 DIAGNOSIS — D123 Benign neoplasm of transverse colon: Secondary | ICD-10-CM

## 2022-02-27 DIAGNOSIS — D120 Benign neoplasm of cecum: Secondary | ICD-10-CM

## 2022-02-27 MED ORDER — SODIUM CHLORIDE 0.9 % IV SOLN: 500.0000 mL | Freq: Once | INTRAVENOUS | Status: DC

## 2022-02-27 NOTE — Progress Notes (Signed)
Called to room to assist during endoscopic procedure.  Patient ID and intended procedure confirmed with present staff. Received instructions for my participation in the procedure from the performing physician.  

## 2022-02-27 NOTE — Patient Instructions (Signed)
Handouts given for polyps and diverticulosis.  Await pathology results.   YOU HAD AN ENDOSCOPIC PROCEDURE TODAY AT [facility]:   Refer to the procedure report that was given to you for any specific questions about what was found during the examination.  If the procedure report does not answer your questions, please call your gastroenterologist to clarify.  If you requested that your care partner not be given the details of your procedure findings, then the procedure report has been included in a sealed envelope for you to review at your convenience later.  YOU SHOULD EXPECT: Some feelings of bloating in the abdomen. Passage of more gas than usual.  Walking can help get rid of the air that was put into your GI tract during the procedure and reduce the bloating. If you had a lower endoscopy (such as a colonoscopy or flexible sigmoidoscopy) you may notice spotting of blood in your stool or on the toilet paper. If you underwent a bowel prep for your procedure, you may not have a normal bowel movement for a few days.  Please Note:  You might notice some irritation and congestion in your nose or some drainage.  This is from the oxygen used during your procedure.  There is no need for concern and it should clear up in a day or so.  SYMPTOMS TO REPORT IMMEDIATELY:  Following lower endoscopy (colonoscopy or flexible sigmoidoscopy):  Excessive amounts of blood in the stool  Significant tenderness or worsening of abdominal pains  Swelling of the abdomen that is new, acute  Fever of 100F or higher For urgent or emergent issues, a gastroenterologist can be reached at any hour by calling [phone]. Do not use MyChart messaging for urgent concerns.    DIET:  We do recommend a small meal at first, but then you may proceed to your regular diet.  Drink plenty of fluids but you should avoid alcoholic beverages for 24 hours.  ACTIVITY:  You should plan to take it easy for the rest of today  and you should NOT DRIVE or use heavy machinery until tomorrow (because of the sedation medicines used during the test).    FOLLOW UP: Our staff will call the number listed on your records the next business day following your procedure.  We will call around 7:15- 8:00 am to check on you and address any questions or concerns that you may have regarding the information given to you following your procedure. If we do not reach you, we will leave a message.  If you develop any symptoms (ie: fever, flu-like symptoms, shortness of breath, cough etc.) before then, please call [phone].  If you test positive for Covid 19 in the 2 weeks post procedure, please call and report this information to [place].    If any biopsies were taken you will be contacted by phone or by letter within the next 1-3 weeks.  Please call us at [phone] if you have not heard about the biopsies in 3 weeks.    SIGNATURES/CONFIDENTIALITY: You and/or your care partner have signed paperwork which will be entered into your electronic medical record.  These signatures attest to the fact that that the information above on your After Visit Summary has been reviewed and is understood.  Full responsibility of the confidentiality of this discharge information lies with you and/or your care-partner.

## 2022-02-27 NOTE — Progress Notes (Signed)
Vss nad trans nto pacu

## 2022-02-27 NOTE — Op Note (Signed)
[facility] Patient Name: Kenneth Austin Procedure Date: 02/27/2022 8:05 AM MRN: [id number] Endoscopist: Sonny Masters "Kenneth Austin ,  Age: 51 Referring MD:  Date of Birth: [birth year] Gender: Male Account #: [id number] Procedure:                Colonoscopy Indications:              Screening for colorectal malignant neoplasm, This                            is the patient's first colonoscopy Medicines:                Monitored Anesthesia Care Procedure:                Pre-Anesthesia Assessment:                           - Prior to the procedure, a History and Physical                            was performed, and patient medications and                            allergies were reviewed. The patient's tolerance of                            previous anesthesia was also reviewed. The risks                            and benefits of the procedure and the sedation                            options and risks were discussed with the patient.                            All questions were answered, and informed consent                            was obtained. Prior Anticoagulants: The patient has                            taken no previous anticoagulant or antiplatelet                            agents. ASA Grade Assessment: II - A patient with                            mild systemic disease. After reviewing the risks                            and benefits, the patient was deemed in                            satisfactory condition to undergo the procedure.  After obtaining informed consent, the colonoscope                            was passed under direct vision. Throughout the                            procedure, the patient's blood pressure, pulse, and                            oxygen saturations were monitored continuously. The                            PCF-HQ190L Colonoscope was introduced through the                            anus and advanced to  the the terminal ileum. The                            colonoscopy was performed without difficulty. The                            patient tolerated the procedure well. The quality                            of the bowel preparation was excellent. The                            terminal ileum, ileocecal valve, appendiceal                            orifice, and rectum were photographed. Scope In: 8:11:11 AM Scope Out: 8:31:07 AM Scope Withdrawal Time: 0 hours 17 minutes 0 seconds  Total Procedure Duration: 0 hours 19 minutes 56 seconds  Findings:                 The terminal ileum appeared normal.                           Two sessile polyps were found in the cecum. The                            polyps were 3 to 5 mm in size. These polyps were                            removed with a cold snare. Resection and retrieval                            were complete.                           A 2 mm polyp was found in the ascending colon. The                            polyp was sessile. The polyp was removed  with a                            cold biopsy forceps. Resection and retrieval were                            complete.                           A 3 mm polyp was found in the transverse colon. The                            polyp was sessile. The polyp was removed with a                            cold snare. Resection and retrieval were complete.                           Multiple small and large-mouthed diverticula were                            found in the sigmoid colon and descending colon.                           Non-bleeding internal hemorrhoids were found during                            retroflexion. Complications:            No immediate complications. Estimated Blood Loss:     Estimated blood loss was minimal. Impression:               - The examined portion of the ileum was normal.                           - Two 3 to 5 mm polyps in the cecum, removed with a                             cold snare. Resected and retrieved.                           - One 2 mm polyp in the ascending colon, removed                            with a cold biopsy forceps. Resected and retrieved.                           - One 3 mm polyp in the transverse colon, removed                            with a cold snare. Resected and retrieved.                           - Diverticulosis in the sigmoid colon and in the  descending colon.                           - Non-bleeding internal hemorrhoids. Recommendation:           - Discharge patient to home (with escort).                           - Await pathology results.                           - The findings and recommendations were discussed                            with the patient. Sonny Masters "Kenneth Austin,  02/27/2022 8:37:48 AM

## 2022-02-27 NOTE — Progress Notes (Signed)
Pt's states no medical or surgical changes since previsit or office visit. 

## 2022-02-27 NOTE — Progress Notes (Signed)
GASTROENTEROLOGY PROCEDURE H&P NOTE   Primary Care Physician: Shelda Pal, DO    Reason for Procedure:   Colon cancer screening  Plan:    Colonoscopy  Patient is appropriate for endoscopic procedure(s) in the ambulatory ([facility]) setting.  The nature of the procedure, as well as the risks, benefits, and alternatives were carefully and thoroughly reviewed with the patient. Ample time for discussion and questions allowed. The patient understood, was satisfied, and agreed to proceed.     HPI: Kenneth Austin is a 51 y.o. male who presents for colonoscopy for colon cancer screening. Denies blood in the stools, changes in bowel habits, weight loss. Denies fam hx of colon cancer.   Past Medical History:  Diagnosis Date   GERD (gastroesophageal reflux disease)    Hypertension    Hypertriglyceridemia    NAFLD (nonalcoholic fatty liver disease)     Past Surgical History:  Procedure Laterality Date   TOOTH EXTRACTION     WISDOM TOOTH EXTRACTION      Prior to Admission medications   Medication Sig Start Date End Date Taking? Authorizing Provider  fenofibrate 160 MG tablet Take 1 tablet (160 mg total) by mouth daily. 11/12/21  Yes Shelda Pal, DO  Multiple Vitamin (MULTIVITAMIN) tablet Take 1 tablet by mouth daily.   Yes [provider]  rosuvastatin (CRESTOR) 20 MG tablet TAKE 1 TABLET BY MOUTH EVERY DAY 01/15/22  Yes Shelda Pal, DO  valsartan (DIOVAN) 320 MG tablet Take 1 tablet (320 mg total) by mouth daily. 01/15/22  Yes Lelon Perla, MD  metroNIDAZOLE (METROGEL) 0.75 % gel Apply topically as needed. Patient not taking: Reported on 02/27/2022 11/05/21   [provider]  sildenafil (REVATIO) 20 MG tablet Take 3 tablets by mouth daily as needed 10/29/21   Shelda Pal, DO    Current Outpatient Medications  Medication Sig Dispense Refill   fenofibrate 160 MG tablet Take 1 tablet (160 mg total) by mouth daily. 30  tablet 3   Multiple Vitamin (MULTIVITAMIN) tablet Take 1 tablet by mouth daily.     rosuvastatin (CRESTOR) 20 MG tablet TAKE 1 TABLET BY MOUTH EVERY DAY 90 tablet 1   valsartan (DIOVAN) 320 MG tablet Take 1 tablet (320 mg total) by mouth daily. 90 tablet 3   metroNIDAZOLE (METROGEL) 0.75 % gel Apply topically as needed. (Patient not taking: Reported on 02/27/2022)     sildenafil (REVATIO) 20 MG tablet Take 3 tablets by mouth daily as needed 30 tablet 5   Current Facility-Administered Medications  Medication Dose Route Frequency Provider Last Rate Last Admin   0.9 %  sodium chloride infusion  500 mL Intravenous Once Sharyn Creamer, MD        Allergies as of 02/27/2022 - Review Complete 02/27/2022  Allergen Reaction Noted   Augmentin [amoxicillin-pot clavulanate] Other (See Comments) 01/16/2015    Family History  Problem Relation Age of Onset   Cancer Mother        lyposarcoma   Heart attack Maternal Grandmother    Hypertension Paternal Grandfather    Stroke Paternal Grandfather    Colon cancer Neg Hx    Esophageal cancer Neg Hx    Stomach cancer Neg Hx    Rectal cancer Neg Hx     Social History   Socioeconomic History   Marital status: Married    Spouse name: Not on file   Number of children: 2   Years of education: Not on file  Highest education level: Not on file  Occupational History   Not on file  Tobacco Use   Smoking status: Never   Smokeless tobacco: Never  Vaping Use   Vaping Use: Never used  Substance and Sexual Activity   Alcohol use: Yes    Alcohol/week: 6.0 standard drinks of alcohol    Types: 6 Standard drinks or equivalent per week   Drug use: No   Sexual activity: Yes  Other Topics Concern   Not on file  Social History Narrative   Not on file   Social Determinants of Health   Financial Resource Strain: Not on file  Food Insecurity: Not on file  Transportation Needs: Not on file  Physical Activity: Not on file  Stress: Not on file  Social  Connections: Not on file  Intimate Partner Violence: Not on file    Physical Exam: Vital signs in last 24 hours: BP (!) 158/101   Pulse 93   Temp 98.9 F (37.2 C) (Temporal)   Ht '6\' 4"'$  (1.93 m)   Wt 241 lb (109.3 kg)   SpO2 99%   BMI 29.34 kg/m  GEN: NAD EYE: Sclerae anicteric ENT: MMM CV: Non-tachycardic Pulm: No increased work of breathing GI: Soft, NT/ND NEURO:  Alert & Oriented   [name], MD [facility]  02/27/2022 8:06 AM

## 2022-02-28 ENCOUNTER — Telehealth: Payer: Self-pay | Admitting: *Deleted

## 2022-02-28 NOTE — Telephone Encounter (Signed)
  Follow up Call-     02/27/2022    7:14 AM  Call back number  Post procedure Call Back phone  # [phone]  Permission to leave phone message Yes     Patient questions:  Do you have a fever, pain , or abdominal swelling? No. Pain Score  0 *  Have you tolerated food without any problems? Yes.    Have you been able to return to your normal activities? Yes.    Do you have any questions about your discharge instructions: Diet   No. Medications  No. Follow up visit  No.  Do you have questions or concerns about your Care? No.  Actions: * If pain score is 4 or above: No action needed, pain <4.

## 2022-03-03 ENCOUNTER — Encounter: Payer: Self-pay | Admitting: Internal Medicine

## 2022-05-06 ENCOUNTER — Other Ambulatory Visit (HOSPITAL_BASED_OUTPATIENT_CLINIC_OR_DEPARTMENT_OTHER): Payer: Self-pay

## 2022-05-06 ENCOUNTER — Other Ambulatory Visit: Payer: Self-pay | Admitting: Family Medicine

## 2022-05-15 ENCOUNTER — Other Ambulatory Visit: Payer: Self-pay | Admitting: Family Medicine

## 2022-05-16 DIAGNOSIS — Z125 Encounter for screening for malignant neoplasm of prostate: Secondary | ICD-10-CM | POA: Diagnosis not present

## 2022-05-16 DIAGNOSIS — R635 Abnormal weight gain: Secondary | ICD-10-CM | POA: Diagnosis not present

## 2022-05-16 DIAGNOSIS — E559 Vitamin D deficiency, unspecified: Secondary | ICD-10-CM | POA: Diagnosis not present

## 2022-05-16 DIAGNOSIS — E611 Iron deficiency: Secondary | ICD-10-CM | POA: Diagnosis not present

## 2022-05-16 DIAGNOSIS — Z1329 Encounter for screening for other suspected endocrine disorder: Secondary | ICD-10-CM | POA: Diagnosis not present

## 2022-05-16 DIAGNOSIS — R7303 Prediabetes: Secondary | ICD-10-CM | POA: Diagnosis not present

## 2022-05-16 DIAGNOSIS — E7800 Pure hypercholesterolemia, unspecified: Secondary | ICD-10-CM | POA: Diagnosis not present

## 2022-05-16 DIAGNOSIS — E291 Testicular hypofunction: Secondary | ICD-10-CM | POA: Diagnosis not present

## 2022-05-16 DIAGNOSIS — E663 Overweight: Secondary | ICD-10-CM | POA: Diagnosis not present

## 2022-05-22 DIAGNOSIS — E7800 Pure hypercholesterolemia, unspecified: Secondary | ICD-10-CM | POA: Diagnosis not present

## 2022-05-22 DIAGNOSIS — R635 Abnormal weight gain: Secondary | ICD-10-CM | POA: Diagnosis not present

## 2022-05-22 DIAGNOSIS — Z1339 Encounter for screening examination for other mental health and behavioral disorders: Secondary | ICD-10-CM | POA: Diagnosis not present

## 2022-05-22 DIAGNOSIS — Z1331 Encounter for screening for depression: Secondary | ICD-10-CM | POA: Diagnosis not present

## 2022-05-22 DIAGNOSIS — Z6829 Body mass index (BMI) 29.0-29.9, adult: Secondary | ICD-10-CM | POA: Diagnosis not present

## 2022-05-22 DIAGNOSIS — E291 Testicular hypofunction: Secondary | ICD-10-CM | POA: Diagnosis not present

## 2022-05-28 DIAGNOSIS — Z6829 Body mass index (BMI) 29.0-29.9, adult: Secondary | ICD-10-CM | POA: Diagnosis not present

## 2022-05-28 DIAGNOSIS — R7303 Prediabetes: Secondary | ICD-10-CM | POA: Diagnosis not present

## 2022-06-04 DIAGNOSIS — Z6829 Body mass index (BMI) 29.0-29.9, adult: Secondary | ICD-10-CM | POA: Diagnosis not present

## 2022-06-04 DIAGNOSIS — I10 Essential (primary) hypertension: Secondary | ICD-10-CM | POA: Diagnosis not present

## 2022-06-23 DIAGNOSIS — Z6828 Body mass index (BMI) 28.0-28.9, adult: Secondary | ICD-10-CM | POA: Diagnosis not present

## 2022-06-23 DIAGNOSIS — E291 Testicular hypofunction: Secondary | ICD-10-CM | POA: Diagnosis not present

## 2022-06-23 DIAGNOSIS — R7303 Prediabetes: Secondary | ICD-10-CM | POA: Diagnosis not present

## 2022-06-23 DIAGNOSIS — Z79890 Hormone replacement therapy (postmenopausal): Secondary | ICD-10-CM | POA: Diagnosis not present

## 2022-06-24 ENCOUNTER — Telehealth: Payer: Self-pay | Admitting: Family Medicine

## 2022-06-24 NOTE — Telephone Encounter (Signed)
Patient called to get scheduled with provider because he had covid last week and he started to get better but he now has a cough and when he coughs he finds himself getting short of breath. Dr. Nani Ravens didn't have anything this week, but patient would prefer to see him instead of another provider. He wanted to know if something like a nebulizer might be able to be called in for him. Please call patient to advise.

## 2022-06-24 NOTE — Telephone Encounter (Signed)
What time/day can you see him//video?

## 2022-06-25 ENCOUNTER — Telehealth (INDEPENDENT_AMBULATORY_CARE_PROVIDER_SITE_OTHER): Payer: BC Managed Care – PPO | Admitting: Family Medicine

## 2022-06-25 ENCOUNTER — Other Ambulatory Visit (HOSPITAL_BASED_OUTPATIENT_CLINIC_OR_DEPARTMENT_OTHER): Payer: Self-pay

## 2022-06-25 ENCOUNTER — Encounter: Payer: Self-pay | Admitting: Family Medicine

## 2022-06-25 DIAGNOSIS — R058 Other specified cough: Secondary | ICD-10-CM

## 2022-06-25 MED ORDER — BENZONATATE 200 MG PO CAPS
200.0000 mg | ORAL_CAPSULE | Freq: Two times a day (BID) | ORAL | 0 refills | Status: DC | PRN
  Filled 2022-06-25: qty 20, 10d supply, fill #0

## 2022-06-25 NOTE — Progress Notes (Signed)
Chief Complaint  Patient presents with   Cough    Post Covid    Kenneth Austin is 51 y.o. and is here for a cough. Due to COVID-19 pandemic, we are interacting via web portal for an electronic face-to-face visit. I verified patient's ID using 2 identifiers. Patient agreed to proceed with visit via this method. Patient is at home, I am at office. Patient and I are present for visit.   Duration: 10 d Productive? No Associated symptoms: coughing, rhinorrhea, headache Denies: nasal congestion, facial pain, chest pain, hemoptysis, dyspnea, wheezing Had covid last week.   Past Medical History:  Diagnosis Date   GERD (gastroesophageal reflux disease)    Hypertension    Hypertriglyceridemia    NAFLD (nonalcoholic fatty liver disease)    Family History  Problem Relation Age of Onset   Cancer Mother        lyposarcoma   Heart attack Maternal Grandmother    Hypertension Paternal Grandfather    Stroke Paternal Grandfather    Colon cancer Neg Hx    Esophageal cancer Neg Hx    Stomach cancer Neg Hx    Rectal cancer Neg Hx    Allergies as of 06/25/2022       Reactions   Augmentin [amoxicillin-pot Clavulanate] Other (See Comments)   Abdominal pain and loose stools.         Medication List        Accurate as of June 25, 2022 11:57 AM. If you have any questions, ask your nurse or doctor.          benzonatate 200 MG capsule Commonly known as: TESSALON Take 1 capsule (200 mg total) by mouth 2 (two) times daily as needed for cough. Started by: Shelda Pal, DO   fenofibrate 160 MG tablet TAKE 1 TABLET BY MOUTH EVERY DAY   metroNIDAZOLE 0.75 % gel Commonly known as: METROGEL Apply topically as needed.   multivitamin tablet Take 1 tablet by mouth daily.   rosuvastatin 20 MG tablet Commonly known as: CRESTOR TAKE 1 TABLET BY MOUTH EVERY DAY   sildenafil 20 MG tablet Commonly known as: REVATIO Take 3 tablets by mouth daily as needed   valsartan 320 MG  tablet Commonly known as: DIOVAN Take 1 tablet (320 mg total) by mouth daily.       Objective No conversational dyspnea Age appropriate judgment and insight Nml affect and mood  Post-viral cough syndrome - Plan: benzonatate (TESSALON) 200 MG capsule  May be turning corner. Will send in Tessalon Perles prn. Send message if not improving, will consider imaging vs ICS.  The patient voiced understanding and agreement to the plan.  [place], DO 06/25/22 11:57 AM

## 2022-06-25 NOTE — Telephone Encounter (Signed)
Scheduled at 11:45

## 2022-07-02 ENCOUNTER — Other Ambulatory Visit (HOSPITAL_BASED_OUTPATIENT_CLINIC_OR_DEPARTMENT_OTHER): Payer: Self-pay

## 2022-07-02 ENCOUNTER — Other Ambulatory Visit: Payer: Self-pay | Admitting: Family Medicine

## 2022-07-02 ENCOUNTER — Encounter: Payer: Self-pay | Admitting: Family Medicine

## 2022-07-02 MED ORDER — PREDNISONE 20 MG PO TABS
40.0000 mg | ORAL_TABLET | Freq: Every day | ORAL | 0 refills | Status: AC
  Filled 2022-07-02: qty 10, 5d supply, fill #0

## 2022-07-04 ENCOUNTER — Other Ambulatory Visit: Payer: Self-pay | Admitting: Family Medicine

## 2022-07-08 DIAGNOSIS — I10 Essential (primary) hypertension: Secondary | ICD-10-CM | POA: Diagnosis not present

## 2022-07-08 DIAGNOSIS — Z6828 Body mass index (BMI) 28.0-28.9, adult: Secondary | ICD-10-CM | POA: Diagnosis not present

## 2022-07-14 IMAGING — US US ABDOMEN LIMITED
1 series · 14 of 25 positions shown · non-contrast
Comparison: CT abdomen pelvis 02/27/2006

CLINICAL DATA: Elevated LFT

EXAM:
ULTRASOUND ABDOMEN LIMITED RIGHT UPPER QUADRANT

[Series 1: us abdomen limited · 14 of 29 slices shown]
[im 1/29]
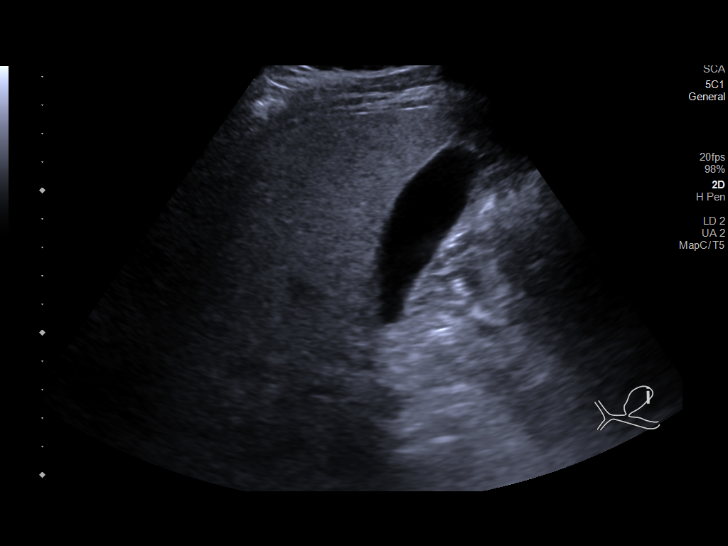
[im 3/29]
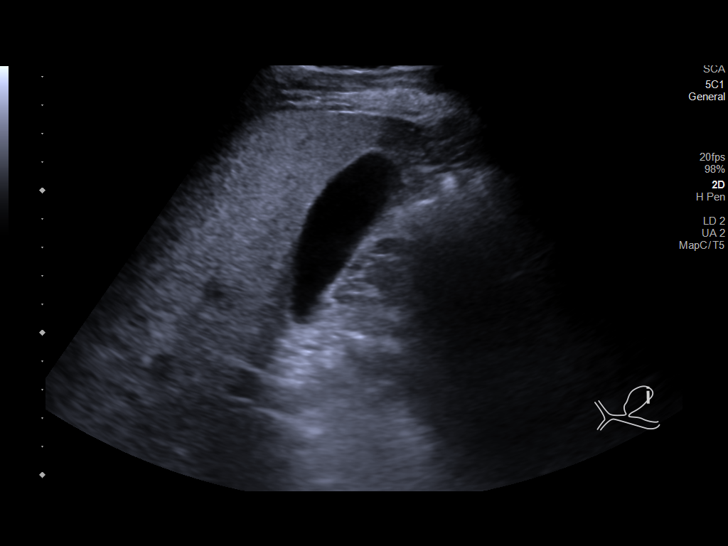
[im 5/29]
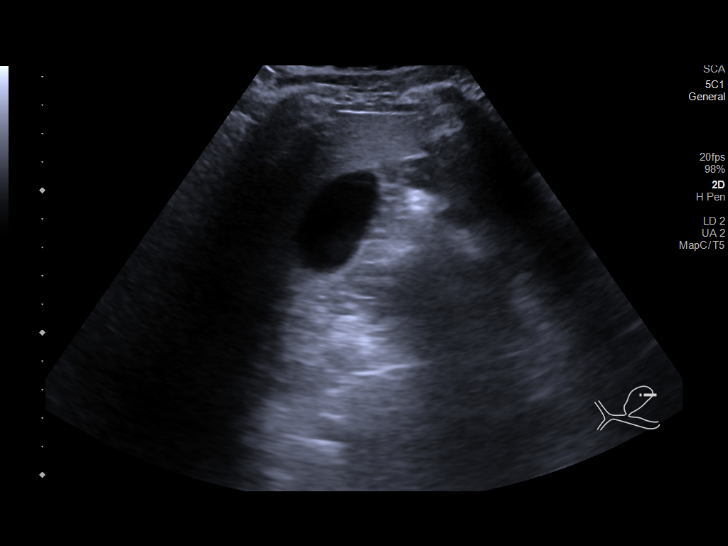
[im 8/29]
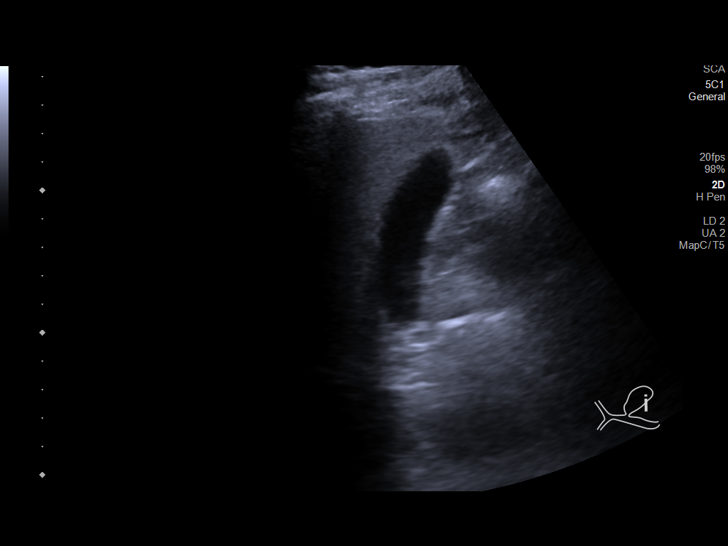
[im 10/29]
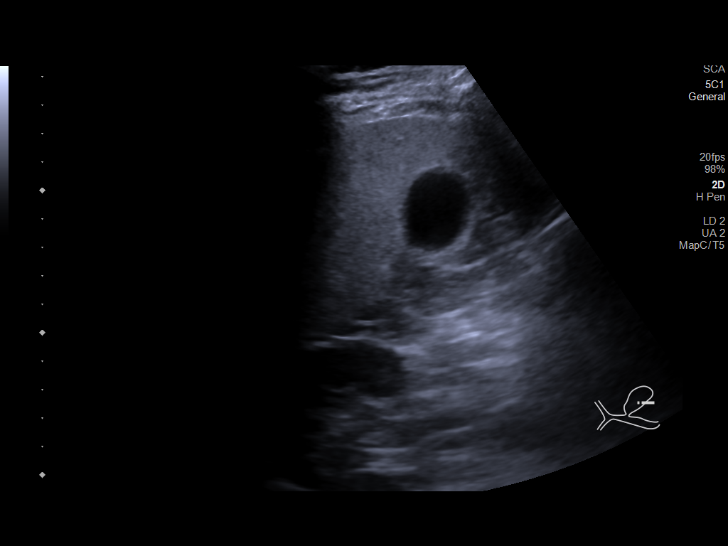
[im 11/29]
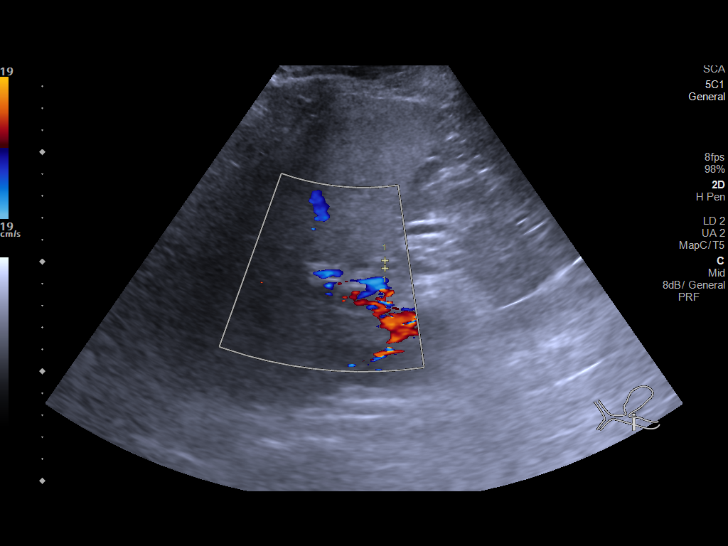
[im 13/29]
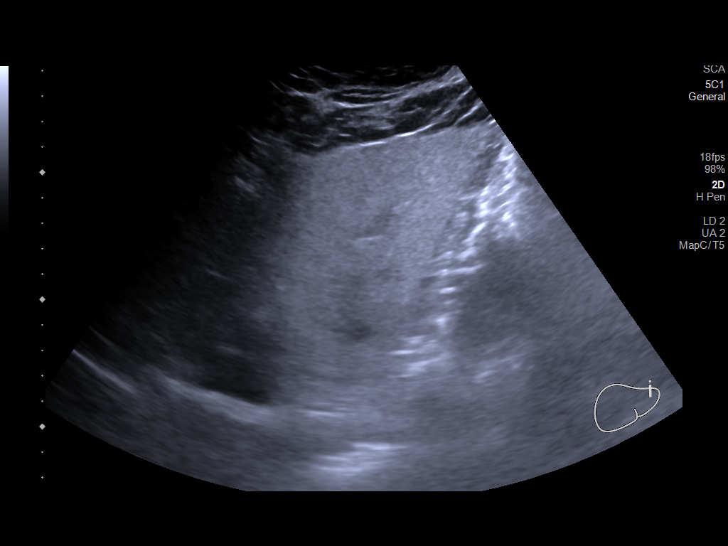
[im 16/29]
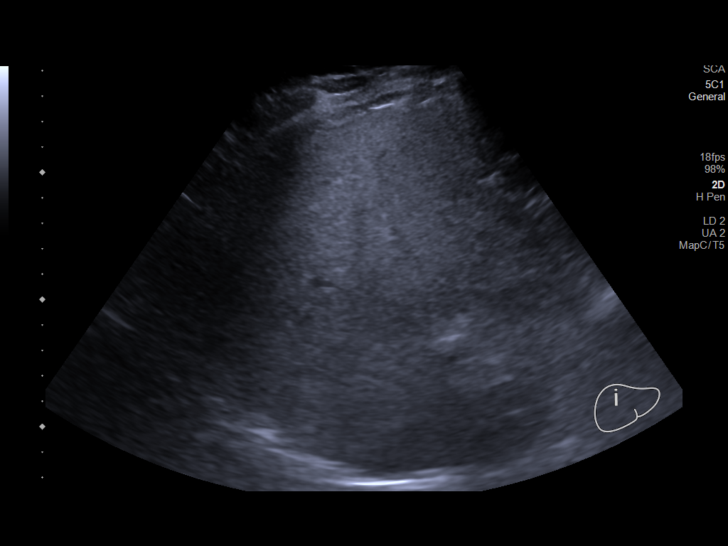
[im 18/29]
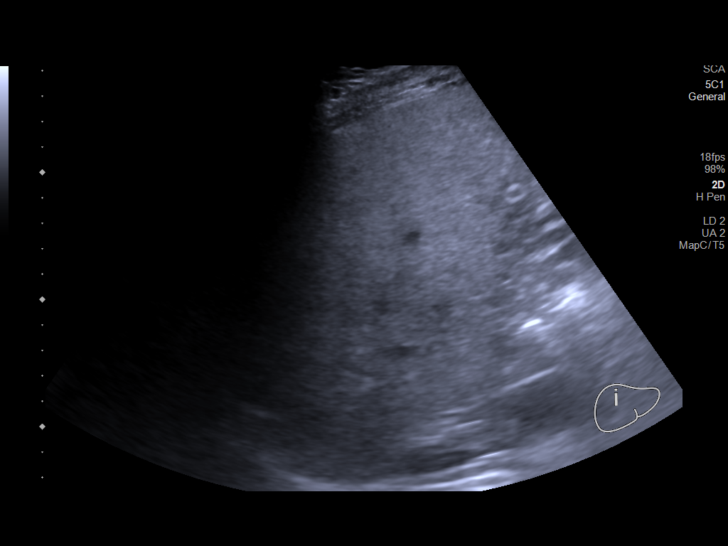
[im 19/29]
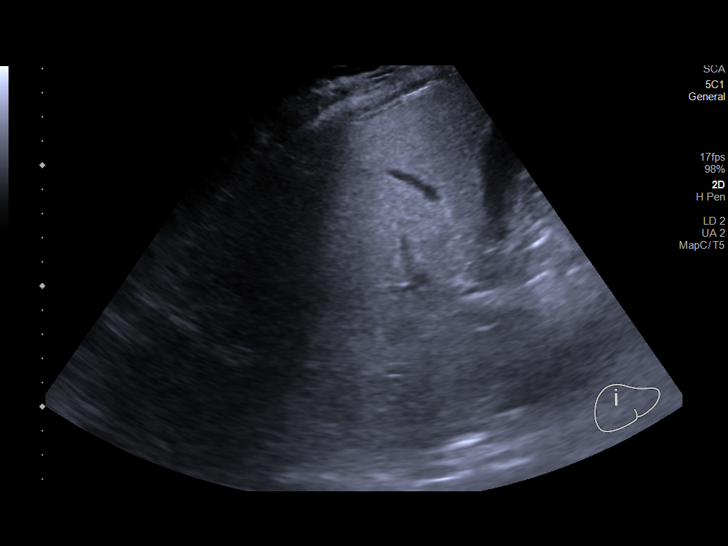
[im 22/29]
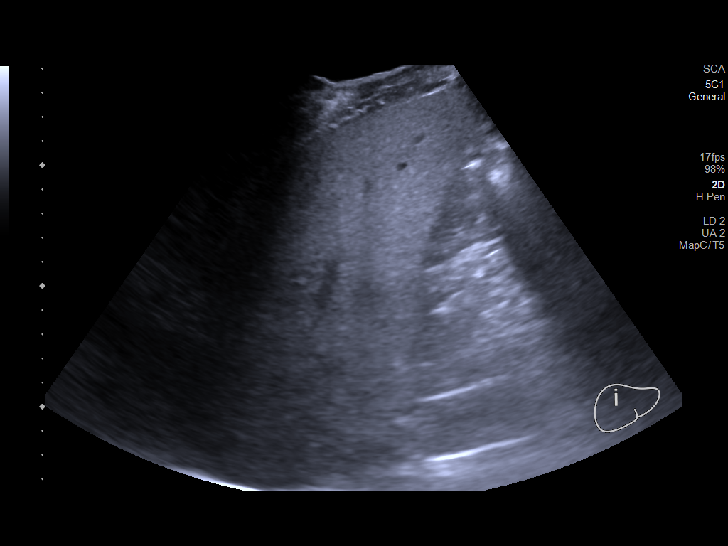
[im 24/29]
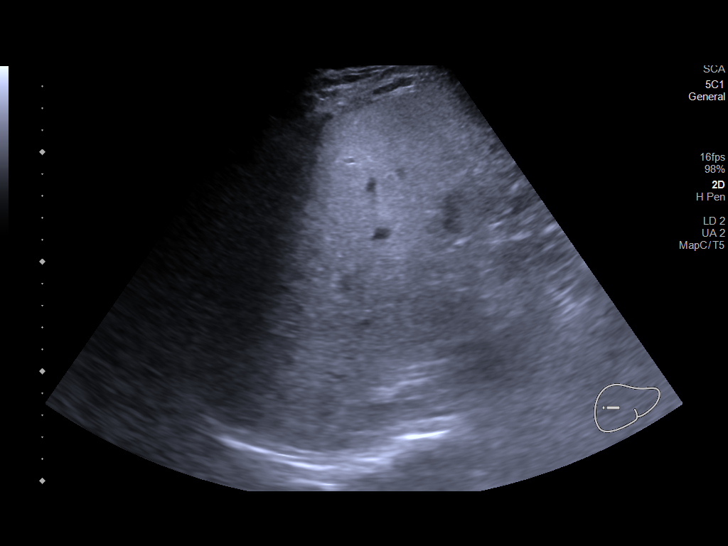
[im 26/29]
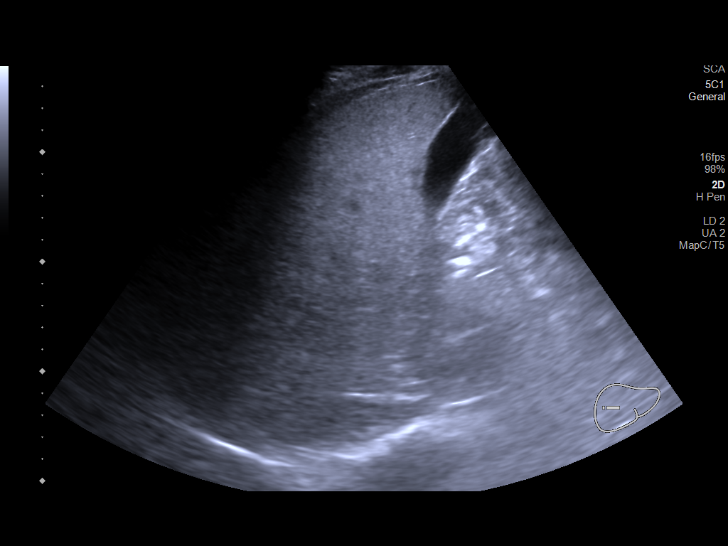
[im 29/29]
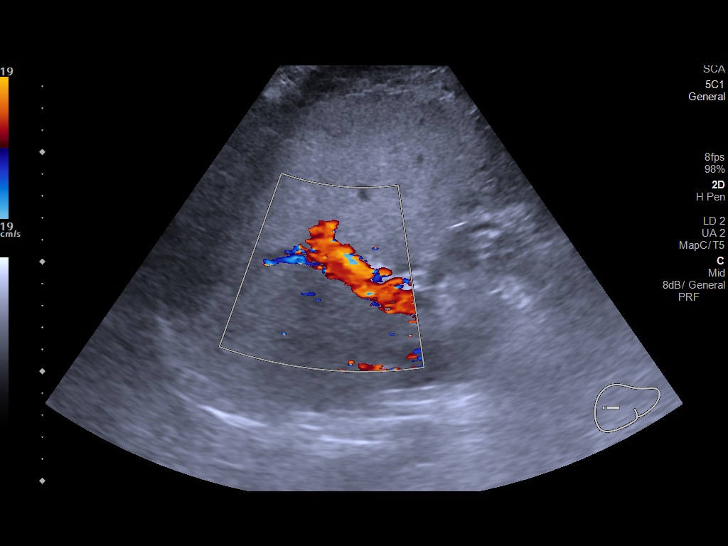

[14 of 25 positions shown; findings below may reference images not displayed]

FINDINGS: Gallbladder:

No gallstones or wall thickening visualized. No sonographic Murphy
sign noted by sonographer.

Common bile duct:

Diameter: 3.6 mm

Liver:

Diffusely echogenic liver parenchyma compatible with fatty
infiltration. No focal liver lesion. Portal vein is patent on color
Doppler imaging with normal direction of blood flow towards the
liver.

Other: Negative for ascites
IMPRESSION: Fatty liver.

Negative for gallstones

## 2022-07-29 DIAGNOSIS — Z6828 Body mass index (BMI) 28.0-28.9, adult: Secondary | ICD-10-CM | POA: Diagnosis not present

## 2022-07-29 DIAGNOSIS — R7303 Prediabetes: Secondary | ICD-10-CM | POA: Diagnosis not present

## 2022-08-09 ENCOUNTER — Other Ambulatory Visit: Payer: Self-pay | Admitting: Family Medicine

## 2022-08-28 DIAGNOSIS — E7800 Pure hypercholesterolemia, unspecified: Secondary | ICD-10-CM | POA: Diagnosis not present

## 2022-08-28 DIAGNOSIS — Z6828 Body mass index (BMI) 28.0-28.9, adult: Secondary | ICD-10-CM | POA: Diagnosis not present

## 2022-10-02 DIAGNOSIS — E611 Iron deficiency: Secondary | ICD-10-CM | POA: Diagnosis not present

## 2022-10-02 DIAGNOSIS — E291 Testicular hypofunction: Secondary | ICD-10-CM | POA: Diagnosis not present

## 2022-10-02 DIAGNOSIS — Z6827 Body mass index (BMI) 27.0-27.9, adult: Secondary | ICD-10-CM | POA: Diagnosis not present

## 2022-10-02 DIAGNOSIS — Z79890 Hormone replacement therapy (postmenopausal): Secondary | ICD-10-CM | POA: Diagnosis not present

## 2022-10-09 DIAGNOSIS — Z6828 Body mass index (BMI) 28.0-28.9, adult: Secondary | ICD-10-CM | POA: Diagnosis not present

## 2022-10-09 DIAGNOSIS — E291 Testicular hypofunction: Secondary | ICD-10-CM | POA: Diagnosis not present

## 2022-10-09 DIAGNOSIS — M2550 Pain in unspecified joint: Secondary | ICD-10-CM | POA: Diagnosis not present

## 2022-10-09 DIAGNOSIS — R6882 Decreased libido: Secondary | ICD-10-CM | POA: Diagnosis not present

## 2022-10-28 ENCOUNTER — Other Ambulatory Visit: Payer: Self-pay | Admitting: Family Medicine

## 2022-10-30 DIAGNOSIS — Z6827 Body mass index (BMI) 27.0-27.9, adult: Secondary | ICD-10-CM | POA: Diagnosis not present

## 2022-10-30 DIAGNOSIS — E291 Testicular hypofunction: Secondary | ICD-10-CM | POA: Diagnosis not present

## 2022-10-30 DIAGNOSIS — E611 Iron deficiency: Secondary | ICD-10-CM | POA: Diagnosis not present

## 2022-10-30 DIAGNOSIS — E559 Vitamin D deficiency, unspecified: Secondary | ICD-10-CM | POA: Diagnosis not present

## 2022-11-13 DIAGNOSIS — D225 Melanocytic nevi of trunk: Secondary | ICD-10-CM | POA: Diagnosis not present

## 2022-11-13 DIAGNOSIS — L821 Other seborrheic keratosis: Secondary | ICD-10-CM | POA: Diagnosis not present

## 2022-11-13 DIAGNOSIS — D2262 Melanocytic nevi of left upper limb, including shoulder: Secondary | ICD-10-CM | POA: Diagnosis not present

## 2022-11-13 DIAGNOSIS — D2261 Melanocytic nevi of right upper limb, including shoulder: Secondary | ICD-10-CM | POA: Diagnosis not present

## 2022-12-27 ENCOUNTER — Other Ambulatory Visit: Payer: Self-pay | Admitting: Cardiology

## 2022-12-27 ENCOUNTER — Other Ambulatory Visit: Payer: Self-pay | Admitting: Family Medicine

## 2023-01-23 ENCOUNTER — Other Ambulatory Visit: Payer: Self-pay | Admitting: Family Medicine

## 2023-02-17 DIAGNOSIS — Z79890 Hormone replacement therapy (postmenopausal): Secondary | ICD-10-CM | POA: Diagnosis not present

## 2023-02-17 DIAGNOSIS — Z6827 Body mass index (BMI) 27.0-27.9, adult: Secondary | ICD-10-CM | POA: Diagnosis not present

## 2023-02-17 DIAGNOSIS — L0291 Cutaneous abscess, unspecified: Secondary | ICD-10-CM | POA: Diagnosis not present

## 2023-04-26 ENCOUNTER — Other Ambulatory Visit: Payer: Self-pay | Admitting: Family Medicine

## 2023-06-02 NOTE — Progress Notes (Unsigned)
HPI: FU CP. Nuclear study October 2021 showed ejection fraction 52%, apical thinning but no ischemia. Echocardiogram September 2021 showed normal LV function, grade 1 diastolic dysfunction. Coronary CTA was canceled in favor of above tests due to persistent tachycardia despite beta-blockade. Since last seen there   Current Outpatient Medications  Medication Sig Dispense Refill   benzonatate (TESSALON) 200 MG capsule Take 1 capsule (200 mg total) by mouth 2 (two) times daily as needed for cough. 20 capsule 0   fenofibrate 160 MG tablet TAKE 1 TABLET BY MOUTH EVERY DAY 90 tablet 0   Multiple Vitamin (MULTIVITAMIN) tablet Take 1 tablet by mouth daily.     rosuvastatin (CRESTOR) 20 MG tablet TAKE 1 TABLET BY MOUTH EVERY DAY 90 tablet 1   sildenafil (REVATIO) 20 MG tablet Take 3 tablets by mouth daily as needed 30 tablet 5   valsartan (DIOVAN) 320 MG tablet TAKE 1 TABLET BY MOUTH EVERY DAY 90 tablet 3   No current facility-administered medications for this visit.     Past Medical History:  Diagnosis Date   GERD (gastroesophageal reflux disease)    Hypertension    Hypertriglyceridemia    NAFLD (nonalcoholic fatty liver disease)     Past Surgical History:  Procedure Laterality Date   TOOTH EXTRACTION     WISDOM TOOTH EXTRACTION      Social History   Socioeconomic History   Marital status: Married    Spouse name: Not on file   Number of children: 2   Years of education: Not on file   Highest education level: Not on file  Occupational History   Not on file  Tobacco Use   Smoking status: Never   Smokeless tobacco: Never  Vaping Use   Vaping status: Never Used  Substance and Sexual Activity   Alcohol use: Yes    Alcohol/week: 6.0 standard drinks of alcohol    Types: 6 Standard drinks or equivalent per week   Drug use: No   Sexual activity: Yes  Other Topics Concern   Not on file  Social History Narrative   Not on file   Social Determinants of Health   Financial  Resource Strain: Not on file  Food Insecurity: Not on file  Transportation Needs: Not on file  Physical Activity: Not on file  Stress: Not on file  Social Connections: Not on file  Intimate Partner Violence: Not on file    Family History  Problem Relation Age of Onset   Cancer Mother        lyposarcoma   Heart attack Maternal Grandmother    Hypertension Paternal Grandfather    Stroke Paternal Grandfather    Colon cancer Neg Hx    Esophageal cancer Neg Hx    Stomach cancer Neg Hx    Rectal cancer Neg Hx     ROS: no fevers or chills, productive cough, hemoptysis, dysphasia, odynophagia, melena, hematochezia, dysuria, hematuria, rash, seizure activity, orthopnea, PND, pedal edema, claudication. Remaining systems are negative.  Physical Exam: Well-developed well-nourished in no acute distress.  Skin is warm and dry.  HEENT is normal.  Neck is supple.  Chest is clear to auscultation with normal expansion.  Cardiovascular exam is regular rate and rhythm.  Abdominal exam nontender or distended. No masses palpated. Extremities show no edema. neuro grossly intact  ECG- personally reviewed  A/P  1 history of chest pain-denies recurrent CP. Previous functional study negative and LV function is normal.     2 palpitations-symptoms are  controlled; can add beta blocker in the future if needed.   3 hypertension-BP controlled; continue present meds.   4 hyperlipidemia-continue crestor and fenofibrate.  Olga Millers, MD

## 2023-06-03 ENCOUNTER — Other Ambulatory Visit: Payer: Self-pay | Admitting: Family Medicine

## 2023-06-03 ENCOUNTER — Encounter: Payer: Self-pay | Admitting: Cardiology

## 2023-06-03 ENCOUNTER — Ambulatory Visit: Payer: BC Managed Care – PPO | Attending: Cardiology | Admitting: Cardiology

## 2023-06-03 ENCOUNTER — Other Ambulatory Visit (HOSPITAL_BASED_OUTPATIENT_CLINIC_OR_DEPARTMENT_OTHER): Payer: Self-pay

## 2023-06-03 VITALS — BP 138/98 | HR 97 | Ht 76.0 in | Wt 219.8 lb

## 2023-06-03 DIAGNOSIS — Z09 Encounter for follow-up examination after completed treatment for conditions other than malignant neoplasm: Secondary | ICD-10-CM | POA: Diagnosis not present

## 2023-06-03 DIAGNOSIS — R072 Precordial pain: Secondary | ICD-10-CM

## 2023-06-03 DIAGNOSIS — E7800 Pure hypercholesterolemia, unspecified: Secondary | ICD-10-CM

## 2023-06-03 DIAGNOSIS — I10 Essential (primary) hypertension: Secondary | ICD-10-CM

## 2023-06-03 DIAGNOSIS — R002 Palpitations: Secondary | ICD-10-CM

## 2023-06-03 DIAGNOSIS — F5221 Male erectile disorder: Secondary | ICD-10-CM

## 2023-06-03 MED ORDER — SILDENAFIL CITRATE 20 MG PO TABS
ORAL_TABLET | ORAL | 5 refills | Status: DC
  Filled 2023-06-03: qty 30, 10d supply, fill #0
  Filled 2024-01-19: qty 30, 10d supply, fill #1

## 2023-06-03 NOTE — Telephone Encounter (Signed)
Last OV--06/25/2022 Upcoming Scheduled appointment is on 06/29/2023 Ok to refill

## 2023-06-03 NOTE — Patient Instructions (Signed)
    Follow-Up: At [facility], you and your health needs are our priority.  As part of our continuing mission to provide you with exceptional heart care, we have created designated Provider Care Teams.  These Care Teams include your primary Cardiologist (physician) and Advanced Practice Providers (APPs -  Physician Assistants and Nurse Practitioners) who all work together to provide you with the care you need, when you need it.  We recommend signing up for the patient portal called "MyChart".  Sign up information is provided on this After Visit Summary.  MyChart is used to connect with patients for Virtual Visits (Telemedicine).  Patients are able to view lab/test results, encounter notes, upcoming appointments, etc.  Non-urgent messages can be sent to your provider as well.   To learn more about what you can do with MyChart, go to [email or web address].    Your next appointment:   12 month(s)  Provider:   Olga Millers MD

## 2023-06-08 ENCOUNTER — Other Ambulatory Visit: Payer: Self-pay

## 2023-06-08 ENCOUNTER — Encounter: Payer: Self-pay | Admitting: Cardiology

## 2023-06-08 NOTE — Progress Notes (Unsigned)
Kenneth Austin 1970/10/22 [id number]  Patient outreached by Nelma Rothman , PharmD Candidate on 06/08/2023.  Blood Pressure Readings: Last documented ambulatory systolic blood pressure: 138 Last documented ambulatory diastolic blood pressure: 98 Systolic BP today: 127 Diastolic BP today: 83 Does the patient have a validated home blood pressure machine?: Yes They report home readings average 127/83  Medication review was performed. Is the patient taking their medications as prescribed?: Yes Differences from their prescribed list include: No longer taking the benzonatate.  The following barriers to adherence were noted: Does the patient have cost concerns?: No Does the patient have transportation concerns?: No Does the patient need assistance obtaining refills?: No Does the patient occassionally forget to take some of their prescribed medications?: No Does the patient feel like one/some of their medications make them feel poorly?: Yes Does the patient have questions or concerns about their medications?: No Does the patient have a follow up scheduled with their primary care provider/cardiologist?: Yes   Interventions: Discussed current regimen with patient. At home control is great and patient is at goal, just had one high BP reading last appointment. Pt endorses some recent hypotension (occurring ~1x week, yesterday had a reading of 106/63). PCP is aware of this and mentioned maybe backing down on valsartan dose. Will revisit at next appt.    The patient has follow up scheduled: Nov 11 PCP: Sharlene Dory, DO   Nelma Rothman, Student-PharmD

## 2023-06-25 ENCOUNTER — Other Ambulatory Visit: Payer: Self-pay | Admitting: Family Medicine

## 2023-06-29 ENCOUNTER — Encounter: Payer: Self-pay | Admitting: Family Medicine

## 2023-06-29 ENCOUNTER — Ambulatory Visit (INDEPENDENT_AMBULATORY_CARE_PROVIDER_SITE_OTHER): Payer: BC Managed Care – PPO | Admitting: Family Medicine

## 2023-06-29 VITALS — BP 130/82 | HR 68 | Temp 98.0°F | Resp 16 | Ht 76.0 in | Wt 222.2 lb

## 2023-06-29 DIAGNOSIS — Z125 Encounter for screening for malignant neoplasm of prostate: Secondary | ICD-10-CM | POA: Diagnosis not present

## 2023-06-29 DIAGNOSIS — Z23 Encounter for immunization: Secondary | ICD-10-CM

## 2023-06-29 DIAGNOSIS — Z0000 Encounter for general adult medical examination without abnormal findings: Secondary | ICD-10-CM

## 2023-06-29 DIAGNOSIS — Z13220 Encounter for screening for lipoid disorders: Secondary | ICD-10-CM

## 2023-06-29 LAB — COMPREHENSIVE METABOLIC PANEL
ALT: 29 U/L (ref 0–53)
AST: 24 U/L (ref 0–37)
Albumin: 4.9 g/dL (ref 3.5–5.2)
Alkaline Phosphatase: 54 U/L (ref 39–117)
BUN: 16 mg/dL (ref 6–23)
CO2: 30 meq/L (ref 19–32)
Calcium: 9.8 mg/dL (ref 8.4–10.5)
Chloride: 103 meq/L (ref 96–112)
Creatinine, Ser: 1.17 mg/dL (ref 0.40–1.50)
GFR: 72.8 mL/min (ref >60.00)
Glucose, Bld: 94 mg/dL (ref 70–99)
Potassium: 4.2 meq/L (ref 3.5–5.1)
Sodium: 140 meq/L (ref 135–145)
Total Bilirubin: 0.6 mg/dL (ref 0.2–1.2)
Total Protein: 7.4 g/dL (ref 6.0–8.3)

## 2023-06-29 LAB — HEMOGLOBIN A1C: Hgb A1c MFr Bld: 6 % (ref 4.6–6.5)

## 2023-06-29 LAB — LIPID PANEL
Cholesterol: 162 mg/dL (ref 0–200)
HDL: 56.9 mg/dL (ref >39.00)
LDL Cholesterol: 64 mg/dL (ref 0–99)
NonHDL: 105.03
Total CHOL/HDL Ratio: 3
Triglycerides: 204 mg/dL — ABNORMAL HIGH (ref 0.0–149.0)
VLDL: 40.8 mg/dL — ABNORMAL HIGH (ref 0.0–40.0)

## 2023-06-29 LAB — CBC
HCT: 43 % (ref 39.0–52.0)
Hemoglobin: 14.5 g/dL (ref 13.0–17.0)
MCHC: 33.7 g/dL (ref 30.0–36.0)
MCV: 95.2 fL (ref 78.0–100.0)
Platelets: 302 10*3/uL (ref 150.0–400.0)
RBC: 4.52 Mil/uL (ref 4.22–5.81)
RDW: 13.2 % (ref 11.5–15.5)
WBC: 4.5 10*3/uL (ref 4.0–10.5)

## 2023-06-29 LAB — PSA: PSA: 0.71 ng/mL (ref 0.10–4.00)

## 2023-06-29 NOTE — Progress Notes (Signed)
Chief Complaint  Patient presents with   Annual Exam    Annual Exam    Well Male Kenneth Austin is here for a complete physical.   His last physical was >1 year ago.  Current diet: in general, a "healthy" diet.  Current exercise: Peloton, walking Weight trend: intentionally losing Fatigue out of ordinary? No. Seat belt? Yes.   Advanced directive? No  Health maintenance Shingrix- No Colonoscopy- Yes Tetanus- Yes HIV- Yes Hep C- Yes   Past Medical History:  Diagnosis Date   GERD (gastroesophageal reflux disease)    Hypertension    Hypertriglyceridemia    NAFLD (nonalcoholic fatty liver disease)       Past Surgical History:  Procedure Laterality Date   TOOTH EXTRACTION     WISDOM TOOTH EXTRACTION      Medications  Current Outpatient Medications on File Prior to Visit  Medication Sig Dispense Refill   fenofibrate 160 MG tablet TAKE 1 TABLET BY MOUTH EVERY DAY 90 tablet 0   Multiple Vitamin (MULTIVITAMIN) tablet Take 1 tablet by mouth daily.     rosuvastatin (CRESTOR) 20 MG tablet Take 1 tablet (20 mg total) by mouth daily. 90 tablet 0   sildenafil (REVATIO) 20 MG tablet Take 3 tablets by mouth daily as needed 30 tablet 5   valsartan (DIOVAN) 320 MG tablet TAKE 1 TABLET BY MOUTH EVERY DAY 90 tablet 3    Allergies Allergies  Allergen Reactions   Augmentin [Amoxicillin-Pot Clavulanate] Other (See Comments)    Abdominal pain and loose stools.     Family History Family History  Problem Relation Age of Onset   Cancer Mother        lyposarcoma   Heart attack Maternal Grandmother    Hypertension Paternal Grandfather    Stroke Paternal Grandfather    Colon cancer Neg Hx    Esophageal cancer Neg Hx    Stomach cancer Neg Hx    Rectal cancer Neg Hx     Review of Systems: Constitutional:  no fevers Eye:  no recent significant change in vision Ear/Nose/Mouth/Throat:  Ears:  no hearing loss Nose/Mouth/Throat:  no complaints of nasal congestion, no sore  throat Cardiovascular:  no chest pain Respiratory:  no shortness of breath Gastrointestinal:  no change in bowel habits GU:  Male: negative for dysuria, frequency Musculoskeletal/Extremities:  no joint pain Integumentary (Skin/Breast):  no abnormal skin lesions reported Neurologic:  no headaches Endocrine: No unexpected weight changes Hematologic/Lymphatic:  no abnormal bleeding  Exam BP 130/82 (BP Location: Left Arm, Patient Position: Sitting, Cuff Size: Normal)   Pulse 68   Temp 98 F (36.7 C) (Oral)   Resp 16   Ht 6\' 4"  (1.93 m)   Wt 222 lb 3.2 oz (100.8 kg)   SpO2 100%   BMI 27.05 kg/m  General:  well developed, well nourished, in no apparent distress Skin:  no significant moles, warts, or growths Head:  no masses, lesions, or tenderness Eyes:  pupils equal and round, sclera anicteric without injection Ears:  canals without lesions, TMs shiny without retraction, no obvious effusion, no erythema Nose:  nares patent, mucosa normal Throat/Pharynx:  lips and gingiva without lesion; tongue and uvula midline; non-inflamed pharynx; no exudates or postnasal drainage Neck: neck supple without adenopathy, thyromegaly, or masses Cardiac: RRR, no bruits, no LE edema Lungs:  clear to auscultation, breath sounds equal bilaterally, no respiratory distress Abdomen: BS+, soft, non-tender, non-distended, no masses or organomegaly noted Rectal: Deferred Musculoskeletal:  symmetrical muscle groups noted  without atrophy or deformity Neuro:  gait normal; deep tendon reflexes normal and symmetric Psych: well oriented with normal range of affect and appropriate judgment/insight  Assessment and Plan  Well adult exam - Plan: CBC, Comprehensive metabolic panel, Lipid panel, Hemoglobin A1c  Screening for prostate cancer - Plan: PSA   Well 52 y.o. male. Counseled on diet and exercise. Add some wt resistance exercise.  Counseled on risks and benefits of prostate cancer screening with PSA. The  patient agrees to undergo testing. 1st Shingrix today. 2nd in 2 mo.  Immunizations, labs, and further orders as above. Follow up in 6 mo. The patient voiced understanding and agreement to the plan.  Jilda Roche [place], DO 06/29/23 11:30 AM

## 2023-06-29 NOTE — Addendum Note (Signed)
Addended by: Kathi Ludwig on: 06/29/2023 11:42 AM   Modules accepted: Orders

## 2023-06-29 NOTE — Patient Instructions (Signed)
Give [place] 2-3 business days to get the results of your labs back.   Keep the diet clean and stay active.  Very strong work with your weight loss.  Please get me a copy of your advanced directive form at your convenience.   The Shingrix vaccine (for shingles) is a 2 shot series spaced 2-6 months apart. It can make people feel low energy, achy and almost like they have the flu for 48 hours after injection. 1/5 people can have nausea and/or vomiting. Please plan accordingly when deciding on when to get this shot. Call our office for a nurse visit appointment to get this. The second shot of the series is less severe regarding the side effects, but it still lasts 48 hours.   Let us know if you need anything.

## 2023-07-01 ENCOUNTER — Other Ambulatory Visit: Payer: Self-pay

## 2023-07-01 DIAGNOSIS — E785 Hyperlipidemia, unspecified: Secondary | ICD-10-CM

## 2023-07-01 DIAGNOSIS — Z136 Encounter for screening for cardiovascular disorders: Secondary | ICD-10-CM

## 2023-07-01 NOTE — Addendum Note (Signed)
Addended by: Kathi Ludwig on: 07/01/2023 11:39 AM   Modules accepted: Orders

## 2023-07-24 ENCOUNTER — Other Ambulatory Visit: Payer: Self-pay | Admitting: Family Medicine

## 2023-07-24 ENCOUNTER — Encounter: Payer: Self-pay | Admitting: Family Medicine

## 2023-07-24 DIAGNOSIS — Z136 Encounter for screening for cardiovascular disorders: Secondary | ICD-10-CM

## 2023-07-29 ENCOUNTER — Ambulatory Visit (HOSPITAL_BASED_OUTPATIENT_CLINIC_OR_DEPARTMENT_OTHER)
Admission: RE | Admit: 2023-07-29 | Discharge: 2023-07-29 | Disposition: A | Discharge status: Home / Self Care | Payer: BC Managed Care – PPO | Source: Ambulatory Visit | Attending: Family Medicine | Admitting: Family Medicine

## 2023-07-29 DIAGNOSIS — Z136 Encounter for screening for cardiovascular disorders: Secondary | ICD-10-CM | POA: Insufficient documentation

## 2023-08-03 ENCOUNTER — Encounter: Payer: Self-pay | Admitting: Family Medicine

## 2023-08-06 ENCOUNTER — Other Ambulatory Visit: Payer: BC Managed Care – PPO

## 2023-08-10 ENCOUNTER — Encounter: Payer: Self-pay | Admitting: Family Medicine

## 2023-09-08 ENCOUNTER — Encounter: Payer: Self-pay | Admitting: Family Medicine

## 2023-09-08 ENCOUNTER — Ambulatory Visit (INDEPENDENT_AMBULATORY_CARE_PROVIDER_SITE_OTHER): Payer: BC Managed Care – PPO

## 2023-09-08 ENCOUNTER — Other Ambulatory Visit (INDEPENDENT_AMBULATORY_CARE_PROVIDER_SITE_OTHER): Payer: BC Managed Care – PPO

## 2023-09-08 DIAGNOSIS — Z23 Encounter for immunization: Secondary | ICD-10-CM

## 2023-09-08 DIAGNOSIS — E785 Hyperlipidemia, unspecified: Secondary | ICD-10-CM | POA: Diagnosis not present

## 2023-09-08 LAB — LIPID PANEL
Cholesterol: 150 mg/dL (ref 0–200)
HDL: 53.9 mg/dL (ref >39.00)
LDL Cholesterol: 47 mg/dL (ref 0–99)
NonHDL: 96
Total CHOL/HDL Ratio: 3
Triglycerides: 245 mg/dL — ABNORMAL HIGH (ref 0.0–149.0)
VLDL: 49 mg/dL — ABNORMAL HIGH (ref 0.0–40.0)

## 2023-09-08 NOTE — Progress Notes (Signed)
Kenneth Austin is a 53 y.o. male presents to the office today for Shingrix #2 injection, per physician's orders. Original order: 06/29/23 Shingrix 0.5 mL  IM was administered L Deltoid today. Patient tolerated injection. Patient due for follow up labs/provider appt: No.  Patient next injection due: n/a  Creft, Melton Alar L

## 2023-09-09 ENCOUNTER — Other Ambulatory Visit: Payer: Self-pay

## 2023-09-09 DIAGNOSIS — E785 Hyperlipidemia, unspecified: Secondary | ICD-10-CM

## 2023-09-09 NOTE — Telephone Encounter (Signed)
Called pt was advised and labs ordered, lab appt scheduled.

## 2023-09-24 ENCOUNTER — Other Ambulatory Visit: Payer: Self-pay | Admitting: Family Medicine

## 2023-10-23 ENCOUNTER — Other Ambulatory Visit: Payer: Self-pay | Admitting: Family Medicine

## 2023-11-10 ENCOUNTER — Other Ambulatory Visit (INDEPENDENT_AMBULATORY_CARE_PROVIDER_SITE_OTHER): Payer: BC Managed Care – PPO

## 2023-11-10 ENCOUNTER — Encounter: Payer: Self-pay | Admitting: Family Medicine

## 2023-11-10 DIAGNOSIS — E785 Hyperlipidemia, unspecified: Secondary | ICD-10-CM

## 2023-11-10 LAB — LIPID PANEL
Cholesterol: 131 mg/dL (ref 0–200)
HDL: 46.7 mg/dL (ref >39.00)
LDL Cholesterol: 58 mg/dL (ref 0–99)
NonHDL: 84.39
Total CHOL/HDL Ratio: 3
Triglycerides: 134 mg/dL (ref 0.0–149.0)
VLDL: 26.8 mg/dL (ref 0.0–40.0)

## 2023-12-01 DIAGNOSIS — L720 Epidermal cyst: Secondary | ICD-10-CM | POA: Diagnosis not present

## 2023-12-01 DIAGNOSIS — L218 Other seborrheic dermatitis: Secondary | ICD-10-CM | POA: Diagnosis not present

## 2023-12-01 DIAGNOSIS — L820 Inflamed seborrheic keratosis: Secondary | ICD-10-CM | POA: Diagnosis not present

## 2023-12-01 DIAGNOSIS — D2261 Melanocytic nevi of right upper limb, including shoulder: Secondary | ICD-10-CM | POA: Diagnosis not present

## 2023-12-01 DIAGNOSIS — L723 Sebaceous cyst: Secondary | ICD-10-CM | POA: Diagnosis not present

## 2023-12-24 ENCOUNTER — Other Ambulatory Visit: Payer: Self-pay | Admitting: Cardiology

## 2023-12-24 ENCOUNTER — Other Ambulatory Visit: Payer: Self-pay | Admitting: Family Medicine

## 2023-12-28 ENCOUNTER — Ambulatory Visit: Payer: BC Managed Care – PPO | Admitting: Family Medicine

## 2024-01-20 ENCOUNTER — Encounter: Payer: Self-pay | Admitting: Family Medicine

## 2024-01-20 ENCOUNTER — Ambulatory Visit (INDEPENDENT_AMBULATORY_CARE_PROVIDER_SITE_OTHER): Admitting: Family Medicine

## 2024-01-20 ENCOUNTER — Ambulatory Visit: Payer: Self-pay | Admitting: Family Medicine

## 2024-01-20 ENCOUNTER — Other Ambulatory Visit (HOSPITAL_BASED_OUTPATIENT_CLINIC_OR_DEPARTMENT_OTHER): Payer: Self-pay

## 2024-01-20 VITALS — BP 128/80 | HR 96 | Temp 98.0°F | Resp 16 | Ht 76.0 in | Wt 232.4 lb

## 2024-01-20 DIAGNOSIS — E782 Mixed hyperlipidemia: Secondary | ICD-10-CM

## 2024-01-20 LAB — COMPREHENSIVE METABOLIC PANEL WITH GFR
ALT: 35 U/L (ref 0–53)
AST: 29 U/L (ref 0–37)
Albumin: 5 g/dL (ref 3.5–5.2)
Alkaline Phosphatase: 62 U/L (ref 39–117)
BUN: 19 mg/dL (ref 6–23)
CO2: 27 meq/L (ref 19–32)
Calcium: 9.6 mg/dL (ref 8.4–10.5)
Chloride: 103 meq/L (ref 96–112)
Creatinine, Ser: 1.29 mg/dL (ref 0.40–1.50)
GFR: 64.49 mL/min (ref >60.00)
Glucose, Bld: 107 mg/dL — ABNORMAL HIGH (ref 70–99)
Potassium: 3.8 meq/L (ref 3.5–5.1)
Sodium: 141 meq/L (ref 135–145)
Total Bilirubin: 0.4 mg/dL (ref 0.2–1.2)
Total Protein: 7.3 g/dL (ref 6.0–8.3)

## 2024-01-20 LAB — LIPID PANEL
Cholesterol: 128 mg/dL (ref 0–200)
HDL: 48.7 mg/dL (ref >39.00)
LDL Cholesterol: 54 mg/dL (ref 0–99)
NonHDL: 79.24
Total CHOL/HDL Ratio: 3
Triglycerides: 128 mg/dL (ref 0.0–149.0)
VLDL: 25.6 mg/dL (ref 0.0–40.0)

## 2024-01-20 NOTE — Patient Instructions (Signed)
 Give [place] 2-3 business days to get the results of your labs back.   Keep the diet clean and stay active.  Let us know if you need anything.

## 2024-01-20 NOTE — Progress Notes (Signed)
 Chief Complaint  Patient presents with   Follow-up    Follow Up    Subjective: Hyperlipidemia Patient presents for Hyperlipidemia follow up. Currently taking Crestor  20 mg/d, fenofibrate  160 mg/d and compliance with treatment thus far has been good. He denies myalgias. He is usually adhering to a healthy diet. Exercise: calisthenics, walking No CP or SOB.  The patient is not known to have coexisting coronary artery disease.  Past Medical History:  Diagnosis Date   GERD (gastroesophageal reflux disease)    Hypertension    Hypertriglyceridemia    NAFLD (nonalcoholic fatty liver disease)     Objective: BP 128/80 (BP Location: Left Arm, Patient Position: Sitting)   Pulse 96   Temp 98 F (36.7 C) (Oral)   Resp 16   Ht 6\' 4"  (1.93 m)   Wt 232 lb 6.4 oz (105.4 kg)   SpO2 99%   BMI 28.29 kg/m  General: Awake, appears stated age Heart: RRR, no LE edema, no bruits Lungs: CTAB, no rales, wheezes or rhonchi. No accessory muscle use Psych: Age appropriate judgment and insight, normal affect and mood  Assessment and Plan: Mixed hyperlipidemia - Plan: Comprehensive metabolic panel with GFR, Lipid panel  Chronic, stable. Cont Crestor  20 mg/d (may be able to reduce if LDL very good.  Continue fenofibrate  160 mg daily.  Counseled on diet and exercise. F/u in 6 mo. The patient voiced understanding and agreement to the plan.  Shellie Dials [place], DO 01/20/24  8:39 AM

## 2024-01-21 ENCOUNTER — Other Ambulatory Visit: Payer: Self-pay | Admitting: Family Medicine

## 2024-01-23 ENCOUNTER — Other Ambulatory Visit: Payer: Self-pay | Admitting: Family Medicine

## 2024-02-21 ENCOUNTER — Other Ambulatory Visit: Payer: Self-pay | Admitting: Family Medicine

## 2024-03-29 ENCOUNTER — Other Ambulatory Visit: Payer: Self-pay | Admitting: Family Medicine

## 2024-04-30 ENCOUNTER — Other Ambulatory Visit: Payer: Self-pay | Admitting: Family Medicine

## 2024-05-29 ENCOUNTER — Other Ambulatory Visit: Payer: Self-pay | Admitting: Family Medicine

## 2024-06-17 ENCOUNTER — Other Ambulatory Visit (HOSPITAL_BASED_OUTPATIENT_CLINIC_OR_DEPARTMENT_OTHER): Payer: Self-pay

## 2024-06-17 ENCOUNTER — Ambulatory Visit: Payer: Self-pay | Admitting: Family Medicine

## 2024-06-17 ENCOUNTER — Encounter: Payer: Self-pay | Admitting: Family Medicine

## 2024-06-17 ENCOUNTER — Ambulatory Visit: Payer: Self-pay

## 2024-06-17 ENCOUNTER — Ambulatory Visit (INDEPENDENT_AMBULATORY_CARE_PROVIDER_SITE_OTHER): Admitting: Family Medicine

## 2024-06-17 VITALS — BP 134/82 | HR 92 | Temp 98.0°F | Resp 16 | Ht 76.0 in | Wt 241.8 lb

## 2024-06-17 DIAGNOSIS — E782 Mixed hyperlipidemia: Secondary | ICD-10-CM | POA: Diagnosis not present

## 2024-06-17 DIAGNOSIS — L308 Other specified dermatitis: Secondary | ICD-10-CM

## 2024-06-17 LAB — LIPID PANEL
Cholesterol: 153 mg/dL (ref 0–200)
HDL: 58.8 mg/dL (ref >39.00)
LDL Cholesterol: 56 mg/dL (ref 0–99)
NonHDL: 94.19
Total CHOL/HDL Ratio: 3
Triglycerides: 193 mg/dL — ABNORMAL HIGH (ref 0.0–149.0)
VLDL: 38.6 mg/dL (ref 0.0–40.0)

## 2024-06-17 LAB — COMPREHENSIVE METABOLIC PANEL WITH GFR
ALT: 45 U/L (ref 0–53)
AST: 34 U/L (ref 0–37)
Albumin: 5.2 g/dL (ref 3.5–5.2)
Alkaline Phosphatase: 72 U/L (ref 39–117)
BUN: 18 mg/dL (ref 6–23)
CO2: 25 meq/L (ref 19–32)
Calcium: 9.8 mg/dL (ref 8.4–10.5)
Chloride: 101 meq/L (ref 96–112)
Creatinine, Ser: 1.16 mg/dL (ref 0.40–1.50)
GFR: 73.05 mL/min (ref >60.00)
Glucose, Bld: 95 mg/dL (ref 70–99)
Potassium: 3.7 meq/L (ref 3.5–5.1)
Sodium: 140 meq/L (ref 135–145)
Total Bilirubin: 0.5 mg/dL (ref 0.2–1.2)
Total Protein: 7.7 g/dL (ref 6.0–8.3)

## 2024-06-17 MED ORDER — PREDNISONE 20 MG PO TABS
40.0000 mg | ORAL_TABLET | Freq: Every day | ORAL | 0 refills | Status: AC
  Filled 2024-06-17: qty 10, 5d supply, fill #0

## 2024-06-17 MED ORDER — TRIAMCINOLONE ACETONIDE 0.1 % EX CREA
1.0000 | TOPICAL_CREAM | Freq: Two times a day (BID) | CUTANEOUS | 0 refills | Status: AC
  Filled 2024-06-17: qty 30, 15d supply, fill #0

## 2024-06-17 MED ORDER — DOXYCYCLINE HYCLATE 100 MG PO TABS
100.0000 mg | ORAL_TABLET | Freq: Two times a day (BID) | ORAL | 0 refills | Status: AC
  Filled 2024-06-17: qty 14, 7d supply, fill #0

## 2024-06-17 NOTE — Progress Notes (Signed)
 Chief Complaint  Patient presents with   Insect Bite    Insect Bite    Kenneth Austin is a 53 y.o. male here for a skin complaint.  Duration: 1 day Location: R hand Pruritic? swelling Painful? Slight pain Drainage? No New soaps/lotions/topicals/detergents? No Trauma? Thought it was a spider bite Other associated symptoms: Slight swelling, redness; no fevers Therapies tried thus far: Benadryl gel, cortisone cream  Past Medical History:  Diagnosis Date   GERD (gastroesophageal reflux disease)    Hypertension    Hypertriglyceridemia    NAFLD (nonalcoholic fatty liver disease)     BP 134/82 (BP Location: Left Arm, Patient Position: Sitting)   Pulse 92   Temp 98 F (36.7 C) (Oral)   Resp 16   Ht 6' 4 (1.93 m)   Wt 241 lb 12.8 oz (109.7 kg)   SpO2 99%   BMI 29.43 kg/m  Gen: awake, alert, appearing stated age Lungs: No accessory muscle use Skin: See below.  No excessive warmth.  Some soft tissue edema.  No drainage, erythema does blanch, no TTP, fluctuance, excoriation Psych: Age appropriate judgment and insight    Pruritic dermatitis - Plan: triamcinolone  cream (KENALOG ) 0.1 %  Mixed hyperlipidemia - Plan: Comprehensive metabolic panel with GFR, Lipid panel  Kenalog  twice daily.  If this does not improve, he will take a 5-day course of oral prednisone  40 mg daily.  If this starts to worsen and become more painful, a pocket prescription for doxycycline  for 7 days was also sent.  I do not think he will need any oral medication though. F/u prn. The patient voiced understanding and agreement to the plan.  Mabel Mt [place], DO 06/17/24 8:40 AM

## 2024-06-17 NOTE — Telephone Encounter (Signed)
 Pt was seen today.

## 2024-06-17 NOTE — Telephone Encounter (Signed)
 FYI Only or Action Required?: this morning 8:15  Patient was last seen in primary care on 01/20/2024 by Frann Mabel Mt, DO.  Called Nurse Triage reporting Insect Bite. - spider hand swelling  Symptoms began yesterday.  Interventions attempted: Nothing.  Symptoms are: unchanged.  Triage Disposition: See Physician Within 24 Hours  Patient/caregiver understands and will follow disposition?: Yes                       Copied from CRM #[id number]. Topic: Clinical - Red Word Triage >> Jun 17, 2024  7:32 AM Harlene ORN wrote: Red Word that prompted transfer to Nurse Triage: got bit by a spider last night; itching and swelling right hand Reason for Disposition  [1] MILD swelling (puffiness) of both hands AND [2] new-onset or getting worse  (Exception: Caused by hot weather or normal pregnancy swelling.)  Answer Assessment - Initial Assessment Questions 1. TYPE of SPIDER: What type of spider was it?  (e.g., name, unknown, or brief description)     Wolf spider 2. LOCATION: Where is the bite located?      Right hand 3. PAIN: Is there any pain? If Yes, ask: How bad is it?  (Scale 1-10; or mild, moderate, severe)     Itching and pressure 4. SWELLING: How big is the swelling? (Inches, cm or compare to coins)      3-4 inches around the bite 5. ONSET: When did the bite occur? (e.g., minutes, hours ago)      Last night 6. TETANUS: When was your last tetanus booster?      unsure 7. OTHER SYMPTOMS: Do you have any other symptoms?  (e.g., muscle cramps, abdomen pain, change in urine color)     no  Answer Assessment - Initial Assessment Questions 1. ONSET: When did the swelling start? (e.g., minutes, hours, days)     Last night 2. LOCATION: What part of the hand is swollen?  Are both hands swollen or just one hand?     Right hand one area 3. TYPE : What does it look like? (e.g., ball, lump; localized; hand swelling)     Red swollen 4. SWELLING  SEVERITY: If more than a lump or localized, ask: How bad is the hand swelling? (e.g., mild, moderate, severe; describe)     3-4 inches 5. REDNESS: Is there redness or signs of infection?     redness 6. PAIN: Is the swelling painful to touch? If Yes, ask: How painful is it?   (Scale 1-10; mild, moderate or severe)     tightness 8. CAUSE: What do you think is causing the hand swelling? (e.g., heat, insect bite, pregnancy, recent injury)     Spider bite  Protocols used: Spider Bite - [place]-A-AH, Hand Swelling-A-AH

## 2024-06-17 NOTE — Patient Instructions (Addendum)
 Give us  2-3 business days to get the results of your labs back.   Try the topical first.   OK to continue topical Benadryl.   Claritin (loratadine), Allegra (fexofenadine), Zyrtec (cetirizine) which is also equivalent to Xyzal (levocetirizine); these are listed in order from weakest to strongest. Generic, and therefore cheaper, options are in the parentheses.   There are available OTC, and the generic versions, which may be cheaper, are in parentheses. Show this to a pharmacist if you have trouble finding any of these items.  If the itching doesn't improve in the next 2 days or so, take the prednisone .   If the area starts becoming painful, take the doxycycline .  Let us  know if you need anything.

## 2024-06-29 ENCOUNTER — Other Ambulatory Visit: Payer: Self-pay | Admitting: Cardiology

## 2024-07-06 NOTE — Progress Notes (Signed)
 HPI: FU CP. Nuclear study October 2021 showed ejection fraction 52%, apical thinning but no ischemia. Echocardiogram September 2021 showed normal LV function, grade 1 diastolic dysfunction. Coronary CTA was canceled in favor of above tests due to persistent tachycardia despite beta-blockade.  Calcium  score December 2024 0.  Since last seen patient denies dyspnea, chest pain or syncope.  Occasional palpitations predominantly under stressful situations and with caffeine.  Current Outpatient Medications  Medication Sig Dispense Refill   fenofibrate  160 MG tablet Take 1 tablet (160 mg total) by mouth daily. 90 tablet 1   Multiple Vitamin (MULTIVITAMIN) tablet Take 1 tablet by mouth daily.     rosuvastatin  (CRESTOR ) 20 MG tablet Take 1 tablet (20 mg total) by mouth daily. 90 tablet 1   sildenafil  (REVATIO ) 20 MG tablet Take 3 tablets by mouth daily as needed 30 tablet 5   triamcinolone  cream (KENALOG ) 0.1 % Apply 1 Application topically 2 (two) times daily. 30 g 0   valsartan  (DIOVAN ) 320 MG tablet TAKE 1 TABLET BY MOUTH EVERY DAY 30 tablet 0   No current facility-administered medications for this visit.     Past Medical History:  Diagnosis Date   GERD (gastroesophageal reflux disease)    Hypertension    Hypertriglyceridemia    NAFLD (nonalcoholic fatty liver disease)     Past Surgical History:  Procedure Laterality Date   TOOTH EXTRACTION     WISDOM TOOTH EXTRACTION      Social History   Socioeconomic History   Marital status: Married    Spouse name: Not on file   Number of children: 2   Years of education: Not on file   Highest education level: Bachelor's degree (e.g., BA, AB, BS)  Occupational History   Not on file  Tobacco Use   Smoking status: Never   Smokeless tobacco: Never  Vaping Use   Vaping status: Never Used  Substance and Sexual Activity   Alcohol use: Yes    Alcohol/week: 6.0 standard drinks of alcohol    Types: 6 Standard drinks or equivalent per week    Drug use: No   Sexual activity: Yes  Other Topics Concern   Not on file  Social History Narrative   ** Merged History Encounter **       Social Drivers of Health   Financial Resource Strain: Low Risk  (01/19/2024)   Overall Financial Resource Strain (CARDIA)    Difficulty of Paying Living Expenses: Not very hard  Food Insecurity: No Food Insecurity (01/19/2024)   Hunger Vital Sign    Worried About Running Out of Food in the Last Year: Never true    Ran Out of Food in the Last Year: Never true  Transportation Needs: No Transportation Needs (01/19/2024)   PRAPARE - Administrator, Civil Service (Medical): No    Lack of Transportation (Non-Medical): No  Physical Activity: Insufficiently Active (01/19/2024)   Exercise Vital Sign    Days of Exercise per Week: 4 days    Minutes of Exercise per Session: 30 min  Stress: No Stress Concern Present (01/19/2024)   [employer] of Occupational Health - Occupational Stress Questionnaire    Feeling of Stress : Only a little  Social Connections: Moderately Integrated (01/19/2024)   Social Connection and Isolation Panel    Frequency of Communication with Friends and Family: More than three times a week    Frequency of Social Gatherings with Friends and Family: Once a week    Attends Religious  Services: 1 to 4 times per year    Active Member of Clubs or Organizations: No    Attends Engineer, Structural: Not on file    Marital Status: Married  Catering Manager Violence: Not on file    Family History  Problem Relation Age of Onset   Cancer Mother        lyposarcoma   Heart attack Maternal Grandmother    Hypertension Paternal Grandfather    Stroke Paternal Grandfather    Colon cancer Neg Hx    Esophageal cancer Neg Hx    Stomach cancer Neg Hx    Rectal cancer Neg Hx     ROS: no fevers or chills, productive cough, hemoptysis, dysphasia, odynophagia, melena, hematochezia, dysuria, hematuria, rash, seizure activity,  orthopnea, PND, pedal edema, claudication. Remaining systems are negative.  Physical Exam: Well-developed well-nourished in no acute distress.  Skin is warm and dry.  HEENT is normal.  Neck is supple.  Chest is clear to auscultation with normal expansion.  Cardiovascular exam is regular rate and rhythm.  Abdominal exam nontender or distended. No masses palpated. Extremities show no edema. neuro grossly intact  EKG Interpretation Date/Time:  Wednesday July 13 2024 09:36:58 EST Ventricular Rate:  101 PR Interval:  160 QRS Duration:  100 QT Interval:  340 QTC Calculation: 440 R Axis:   19  Text Interpretation: Sinus tachycardia Incomplete right bundle branch block Confirmed by Pietro Rogue ([id number]) on 07/13/2024 10:11:08 AM      A/P  1 previous chest pain-no recurrences.  Calcium  score 0 and previous functional study showed no ischemia.  LV function is normal.  2 hypertension-patient's blood pressure is elevated; add carvedilol  6.25 mg twice daily and advance as needed.  3 hyperlipidemia-continue Crestor  and fenofibrate .  4 history of palpitations-still with occasional palpitations predominate under stress, with caffeine.  I am adding carvedilol  both for blood pressure and palpitations.  We can advance if needed.  Rogue Pietro, MD

## 2024-07-09 ENCOUNTER — Other Ambulatory Visit: Payer: Self-pay | Admitting: Family Medicine

## 2024-07-11 ENCOUNTER — Other Ambulatory Visit: Payer: Self-pay | Admitting: Family Medicine

## 2024-07-11 ENCOUNTER — Other Ambulatory Visit (HOSPITAL_BASED_OUTPATIENT_CLINIC_OR_DEPARTMENT_OTHER): Payer: Self-pay

## 2024-07-11 DIAGNOSIS — F5221 Male erectile disorder: Secondary | ICD-10-CM

## 2024-07-11 MED ORDER — SILDENAFIL CITRATE 20 MG PO TABS
ORAL_TABLET | ORAL | 5 refills | Status: AC
  Filled 2024-07-11: qty 30, 10d supply, fill #0

## 2024-07-13 ENCOUNTER — Ambulatory Visit: Attending: Cardiology | Admitting: Cardiology

## 2024-07-13 ENCOUNTER — Encounter: Payer: Self-pay | Admitting: Cardiology

## 2024-07-13 VITALS — BP 155/94 | HR 101 | Ht 76.0 in | Wt 242.0 lb

## 2024-07-13 DIAGNOSIS — R002 Palpitations: Secondary | ICD-10-CM | POA: Diagnosis not present

## 2024-07-13 DIAGNOSIS — I10 Essential (primary) hypertension: Secondary | ICD-10-CM | POA: Diagnosis not present

## 2024-07-13 DIAGNOSIS — E7800 Pure hypercholesterolemia, unspecified: Secondary | ICD-10-CM | POA: Diagnosis not present

## 2024-07-13 MED ORDER — CARVEDILOL 6.25 MG PO TABS: 6.2500 mg | ORAL_TABLET | Freq: Two times a day (BID) | ORAL | 3 refills | Status: AC

## 2024-07-13 NOTE — Patient Instructions (Signed)
 Medication Instructions:   START CARVEDILOL  6.25 MG ONE TABLET TWICE DAILY  *If you need a refill on your cardiac medications before your next appointment, please call your pharmacy*   Follow-Up: At [facility], you and your health needs are our priority.  As part of our continuing mission to provide you with exceptional heart care, our providers are all part of one team.  This team includes your primary Cardiologist (physician) and Advanced Practice Providers or APPs (Physician Assistants and Nurse Practitioners) who all work together to provide you with the care you need, when you need it.  Your next appointment:   12 month(s)  Provider:   Redell Shallow, MD

## 2024-07-17 ENCOUNTER — Other Ambulatory Visit: Payer: Self-pay | Admitting: Family Medicine

## 2024-07-29 ENCOUNTER — Other Ambulatory Visit: Payer: Self-pay | Admitting: Cardiology

## 2024-08-29 ENCOUNTER — Telehealth (INDEPENDENT_AMBULATORY_CARE_PROVIDER_SITE_OTHER): Admitting: Family Medicine

## 2024-08-29 ENCOUNTER — Other Ambulatory Visit (HOSPITAL_BASED_OUTPATIENT_CLINIC_OR_DEPARTMENT_OTHER): Payer: Self-pay

## 2024-08-29 ENCOUNTER — Encounter: Payer: Self-pay | Admitting: Family Medicine

## 2024-08-29 DIAGNOSIS — M545 Low back pain, unspecified: Secondary | ICD-10-CM

## 2024-08-29 MED ORDER — PREDNISONE 20 MG PO TABS
40.0000 mg | ORAL_TABLET | Freq: Every day | ORAL | 0 refills | Status: AC
  Filled 2024-08-29: qty 10, 5d supply, fill #0

## 2024-08-29 MED ORDER — CYCLOBENZAPRINE HCL 5 MG PO TABS
5.0000 mg | ORAL_TABLET | Freq: Three times a day (TID) | ORAL | 1 refills | Status: AC | PRN
  Filled 2024-08-29: qty 30, 10d supply, fill #0

## 2024-08-29 NOTE — Progress Notes (Signed)
 Virtual Video Visit via MyChart Note  I connected with  Kenneth Austin on 08/29/2024 at  8:20 AM EST by the video enabled telemedicine application for MyChart, and verified that I am speaking with the correct person using two identifiers.   I introduced myself as a Publishing Rights Manager with the practice. We discussed the limitations of evaluation and management by telemedicine and the availability of in person appointments. The patient expressed understanding and agreed to proceed.  Participating parties in this visit include: The patient and the nurse practitioner listed.  The patient is: At home I am: In the office - [facility]  Subjective:    CC:  Chief Complaint  Patient presents with   Back Pain    HPI: Kenneth Austin is a 54 y.o. year old male presenting today via MyChart today for back pain.  Discussed the use of AI scribe software for clinical note transcription with the patient, who gave verbal consent to proceed.  History of Present Illness Kenneth Austin is a 54 year old male who presents with acute lower back pain.  He reports lower back pain that began yesterday while working in a crawl space. The pain started after he twisted in an awkward position to pick up tools, causing his back to lock up. He had difficulty standing up straight after crawling out from under the space.  The pain is a constant dull ache across the lower back, with tightness that prevents him from standing fully upright. Movement, especially rolling over in bed, exacerbates the pain, causing it to spike to an eight out of ten. The pain subsides when he finds a comfortable position.  He has a history of similar episodes occurring every couple of years, with the last severe episode in 2021. He has been using an inversion table and performing back stretches, which have not provided significant relief. He rates his pain as a three out of ten while sitting, a  five when moving, and an eight during certain movements. He has been taking Aleve, two in the morning, two at night, and one in the middle of the day since yesterday, acknowledging a slightly higher dose than recommended due to his size.  No radiating pain down the legs, numbness in the genital area, or bowel and bladder issues. He mentions a tight sensation in the right glute, described as a pinpoint tightness rather than pain.         Past medical history, Surgical history, Family history not pertinant except as noted below, Social history, Allergies, and medications have been entered into the medical record, reviewed, and corrections made.   Review of Systems:  All review of systems negative except what is listed in the HPI   Objective:    General:  Speaking clearly in complete sentences. Absent shortness of breath noted.   Alert and oriented x3.   Normal judgment.  Absent acute distress.   Impression and Recommendations:    Problem List Items Addressed This Visit   None Visit Diagnoses       Acute bilateral low back pain without sciatica    -  Primary   Relevant Medications   predniSONE  (DELTASONE ) 20 MG tablet   cyclobenzaprine  (FLEXERIL ) 5 MG tablet       Assessment & Plan Acute low back pain with muscle spasm Acute low back pain with muscle spasm, likely muscular, following awkward twisting. Pain constant, dull, throbbing, worsens with movement. No  radiating pain, numbness, or bowel/bladder dysfunction. Current episode severe, affecting mobility and daily activities. - Prescribed 5-day prednisone  burst, monitor blood pressure. - Prescribed Flexeril  for muscle relaxation. - Advised against Aleve, Advil, or ibuprofen while on prednisone . - Recommended heating pad and stretching exercises. - Advised against heavy lifting. - Instructed to contact if no improvement in a couple of days for potential physical therapy referral.      Follow-up if symptoms worsen  or fail to improve.    I discussed the assessment and treatment plan with the patient. The patient was provided an opportunity to ask questions and all were answered. The patient agreed with the plan and demonstrated an understanding of the instructions.   The patient was advised to call back or seek an in-person evaluation if the symptoms worsen or if the condition fails to improve as anticipated.   Kenneth Austin Mon, NP
